# Patient Record
Sex: Female | Born: 1975 | Race: White | Hispanic: No | Marital: Single | Smoking: Current every day smoker
Health system: Southern US, Community
[De-identification: ages and names within clinical notes are randomized; demographics above are authoritative.]

## PROBLEM LIST (undated history)

## (undated) DIAGNOSIS — F321 Major depressive disorder, single episode, moderate: Secondary | ICD-10-CM

## (undated) DIAGNOSIS — H6980 Other specified disorders of Eustachian tube, unspecified ear: Secondary | ICD-10-CM

## (undated) DIAGNOSIS — K5010 Crohn's disease of large intestine without complications: Secondary | ICD-10-CM

## (undated) DIAGNOSIS — M79671 Pain in right foot: Secondary | ICD-10-CM

## (undated) HISTORY — PX: TONSILLECTOMY: SUR1361

## (undated) HISTORY — PX: ROOT CANAL: SHX2363

---
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
## 2002-01-31 ENCOUNTER — Emergency Department (HOSPITAL_COMMUNITY): Admission: EM | Admit: 2002-01-31 | Discharge: 2002-01-31 | Payer: Self-pay | Admitting: Emergency Medicine

## 2008-04-05 ENCOUNTER — Emergency Department (HOSPITAL_BASED_OUTPATIENT_CLINIC_OR_DEPARTMENT_OTHER): Admission: EM | Admit: 2008-04-05 | Discharge: 2008-04-05 | Payer: Self-pay | Admitting: Emergency Medicine

## 2009-08-08 ENCOUNTER — Emergency Department (HOSPITAL_BASED_OUTPATIENT_CLINIC_OR_DEPARTMENT_OTHER): Admission: EM | Admit: 2009-08-08 | Discharge: 2009-08-08 | Payer: Self-pay | Admitting: Emergency Medicine

## 2009-11-30 ENCOUNTER — Emergency Department (HOSPITAL_BASED_OUTPATIENT_CLINIC_OR_DEPARTMENT_OTHER): Admission: EM | Admit: 2009-11-30 | Discharge: 2009-11-30 | Payer: Self-pay | Admitting: Emergency Medicine

## 2010-01-11 ENCOUNTER — Emergency Department (HOSPITAL_BASED_OUTPATIENT_CLINIC_OR_DEPARTMENT_OTHER): Admission: EM | Admit: 2010-01-11 | Discharge: 2010-01-11 | Payer: Self-pay | Admitting: Emergency Medicine

## 2010-01-11 ENCOUNTER — Ambulatory Visit: Payer: Self-pay | Admitting: Radiology

## 2010-04-18 ENCOUNTER — Emergency Department (HOSPITAL_BASED_OUTPATIENT_CLINIC_OR_DEPARTMENT_OTHER): Admission: EM | Admit: 2010-04-18 | Discharge: 2010-04-18 | Payer: Self-pay | Admitting: Emergency Medicine

## 2010-04-26 ENCOUNTER — Emergency Department (HOSPITAL_BASED_OUTPATIENT_CLINIC_OR_DEPARTMENT_OTHER): Admission: EM | Admit: 2010-04-26 | Discharge: 2010-04-26 | Payer: Self-pay | Admitting: Emergency Medicine

## 2010-04-28 ENCOUNTER — Emergency Department (HOSPITAL_BASED_OUTPATIENT_CLINIC_OR_DEPARTMENT_OTHER): Admission: EM | Admit: 2010-04-28 | Discharge: 2010-04-28 | Payer: Self-pay | Admitting: Emergency Medicine

## 2010-05-08 ENCOUNTER — Encounter (HOSPITAL_BASED_OUTPATIENT_CLINIC_OR_DEPARTMENT_OTHER): Admission: RE | Admit: 2010-05-08 | Discharge: 2010-08-06 | Payer: Self-pay | Admitting: General Surgery

## 2010-07-15 ENCOUNTER — Emergency Department (HOSPITAL_BASED_OUTPATIENT_CLINIC_OR_DEPARTMENT_OTHER): Admission: EM | Admit: 2010-07-15 | Discharge: 2010-07-15 | Payer: Self-pay | Admitting: Emergency Medicine

## 2010-09-01 ENCOUNTER — Emergency Department (HOSPITAL_BASED_OUTPATIENT_CLINIC_OR_DEPARTMENT_OTHER): Admission: EM | Admit: 2010-09-01 | Discharge: 2010-09-01 | Payer: Self-pay | Admitting: Emergency Medicine

## 2011-01-14 LAB — COMPREHENSIVE METABOLIC PANEL
ALT: 33 U/L (ref 0–35)
AST: 26 U/L (ref 0–37)
BUN: 10 mg/dL (ref 6–23)
Creatinine, Ser: 0.7 mg/dL (ref 0.4–1.2)
GFR calc Af Amer: >60 mL/min (ref >60)

## 2011-01-17 LAB — STREP A DNA PROBE

## 2011-04-13 ENCOUNTER — Emergency Department (HOSPITAL_BASED_OUTPATIENT_CLINIC_OR_DEPARTMENT_OTHER)
Admission: EM | Admit: 2011-04-13 | Discharge: 2011-04-13 | Disposition: A | Discharge status: Home / Self Care | Payer: Self-pay | Attending: Emergency Medicine | Admitting: Emergency Medicine

## 2011-04-13 DIAGNOSIS — J329 Chronic sinusitis, unspecified: Secondary | ICD-10-CM | POA: Insufficient documentation

## 2011-07-26 LAB — COMPREHENSIVE METABOLIC PANEL
ALT: 17
AST: 24
Albumin: 4.3
Creatinine, Ser: 0.8
Glucose, Bld: 103 — ABNORMAL HIGH
Total Protein: 7.7

## 2011-07-26 LAB — URINALYSIS, ROUTINE W REFLEX MICROSCOPIC
Ketones, ur: 15 — AB
Nitrite: NEGATIVE
Specific Gravity, Urine: 1.03
Urobilinogen, UA: 1
pH: 5.5

## 2011-07-26 LAB — URINE MICROSCOPIC-ADD ON

## 2011-07-26 LAB — DIFFERENTIAL
Basophils Relative: 0
Eosinophils Relative: 1
Lymphs Abs: 2.1
Neutro Abs: 8.5 — ABNORMAL HIGH
Neutrophils Relative %: 74

## 2011-07-26 LAB — CBC
HCT: 44.6
Hemoglobin: 15.8 — ABNORMAL HIGH
MCHC: 35.4
MCV: 86
RBC: 5.18 — ABNORMAL HIGH
RDW: 11.4 — ABNORMAL LOW

## 2011-07-26 LAB — LIPASE, BLOOD: Lipase: 55

## 2012-01-29 ENCOUNTER — Encounter (HOSPITAL_BASED_OUTPATIENT_CLINIC_OR_DEPARTMENT_OTHER): Payer: Self-pay | Admitting: *Deleted

## 2012-01-29 ENCOUNTER — Emergency Department (HOSPITAL_BASED_OUTPATIENT_CLINIC_OR_DEPARTMENT_OTHER)
Admission: EM | Admit: 2012-01-29 | Discharge: 2012-01-29 | Disposition: A | Discharge status: Home / Self Care | Payer: Self-pay | Attending: Emergency Medicine | Admitting: Emergency Medicine

## 2012-01-29 DIAGNOSIS — R221 Localized swelling, mass and lump, neck: Secondary | ICD-10-CM | POA: Insufficient documentation

## 2012-01-29 DIAGNOSIS — R220 Localized swelling, mass and lump, head: Secondary | ICD-10-CM | POA: Insufficient documentation

## 2012-01-29 DIAGNOSIS — F17200 Nicotine dependence, unspecified, uncomplicated: Secondary | ICD-10-CM | POA: Insufficient documentation

## 2012-01-29 DIAGNOSIS — K047 Periapical abscess without sinus: Secondary | ICD-10-CM | POA: Insufficient documentation

## 2012-01-29 DIAGNOSIS — K029 Dental caries, unspecified: Secondary | ICD-10-CM | POA: Insufficient documentation

## 2012-01-29 MED ORDER — CLINDAMYCIN HCL 150 MG PO CAPS: 150.0000 mg | ORAL_CAPSULE | Freq: Three times a day (TID) | ORAL | Status: AC

## 2012-01-29 NOTE — ED Provider Notes (Signed)
Medical screening examination/treatment/procedure(s) were performed by non-physician practitioner and as supervising physician I was immediately available for consultation/collaboration.   Gwyneth Sprout, MD 01/29/12 [phone]

## 2012-01-29 NOTE — Discharge Instructions (Signed)
Dental Abscess A dental abscess usually starts from an infected tooth. Antibiotic medicine and pain pills can be helpful, but dental infections require the attention of a dentist. Rinse around the infected area often with salt water (a pinch of salt in 8 oz of warm water). Do not apply heat to the outside of your face. See your dentist or oral surgeon as soon as possible.  SEEK IMMEDIATE MEDICAL CARE IF:  You have increasing, severe pain that is not relieved by medicine.   You or your child has an oral temperature above 102 F (38.9 C), not controlled by medicine.   Your baby is older than 3 months with a rectal temperature of 102 F (38.9 C) or higher.   Your baby is 3 months old or younger with a rectal temperature of 100.4 F (38 C) or higher.   You develop chills, severe headache, difficulty breathing, or trouble swallowing.   You have swelling in the neck or around the eye.  Document Released: 10/15/2005 Document Revised: 10/04/2011 Document Reviewed: 03/26/2007 [employer] Patient Information 2012 [employer]. 

## 2012-01-29 NOTE — ED Notes (Signed)
Pt. Is to have her teeth fixed in approx. 2 wks.  Noted abcess behind the lower two front teeth.  Pt. Reports no pain and wants an abx. For her abcess.

## 2012-01-29 NOTE — ED Provider Notes (Signed)
History     CSN: [id number]  Arrival date & time 01/29/12  1434   First MD Initiated Contact with Patient 01/29/12 1450      Chief Complaint  Patient presents with  . Abscess    just behind the lower front teeth she has an abcess that has drained and builds back up with fluid per Pt.    (Consider location/radiation/quality/duration/timing/severity/associated sxs/prior treatment) HPI Comments: Pt states that she is supposed to have multiple upper teeth fixed in 2 weeks:and she noted some swelling and drainage from the gum in the low front teeth  Patient is a 36 y.o. female presenting with tooth pain. The history is provided by the patient. No language interpreter was used.  Dental PainThe primary symptoms include mouth pain. Primary symptoms do not include fever or sore throat. The symptoms began 12 to 24 hours ago. The symptoms are worsening. The symptoms occur constantly.  Additional symptoms do not include: jaw pain.    History reviewed. No pertinent past medical history.  Past Surgical History  Procedure Date  . Root canal     History reviewed. No pertinent family history.  History  Substance Use Topics  . Smoking status: Current Everyday Smoker -- 0.5 packs/day    Types: Cigarettes  . Smokeless tobacco: Not on file  . Alcohol Use: Yes    OB History    Grav Para Term Preterm Abortions TAB SAB Ect Mult Living                  Review of Systems  Constitutional: Negative for fever.  HENT: Negative.  Negative for sore throat.   Respiratory: Negative.   Cardiovascular: Negative.   Musculoskeletal: Negative.     Allergies  Review of patient's allergies indicates no known allergies.  Home Medications  No current outpatient prescriptions on file.  BP 133/91  Pulse 95  Temp(Src) 98.2 F (36.8 C) (Oral)  Resp 18  Ht 5\' 5"  (1.651 m)  Wt 270 lb (122.471 kg)  BMI 44.93 kg/m2  SpO2 96%  LMP 01/15/2012  Physical Exam  Nursing note and vitals  reviewed. Constitutional: She is oriented to person, place, and time. She appears well-developed and well-nourished.  HENT:  Mouth/Throat: Oropharynx is clear and moist.       Pt has multiple decayed teeth:pt has some swelling noted on the gum to the lower front teeth:no drainage noted:no jaw swelling noted  Eyes: Conjunctivae and EOM are normal.  Cardiovascular: Normal rate and regular rhythm.   Pulmonary/Chest: Effort normal and breath sounds normal.  Musculoskeletal: Normal range of motion.  Neurological: She is alert and oriented to person, place, and time.  Skin: Skin is dry.    ED Course  Procedures (including critical care time)  Labs Reviewed - No data to display No results found.   1. Dental abscess       MDM  No sign of ludwigs angina:will treat and pt is supposed to follow up with dentist        Teressa Lower, NP 01/29/12 1511

## 2012-08-29 ENCOUNTER — Encounter (HOSPITAL_BASED_OUTPATIENT_CLINIC_OR_DEPARTMENT_OTHER): Payer: Self-pay

## 2012-08-29 ENCOUNTER — Emergency Department (HOSPITAL_BASED_OUTPATIENT_CLINIC_OR_DEPARTMENT_OTHER)
Admission: EM | Admit: 2012-08-29 | Discharge: 2012-08-29 | Disposition: A | Discharge status: Home / Self Care | Payer: Self-pay | Attending: Emergency Medicine | Admitting: Emergency Medicine

## 2012-08-29 DIAGNOSIS — F17200 Nicotine dependence, unspecified, uncomplicated: Secondary | ICD-10-CM | POA: Insufficient documentation

## 2012-08-29 DIAGNOSIS — J329 Chronic sinusitis, unspecified: Secondary | ICD-10-CM | POA: Insufficient documentation

## 2012-08-29 DIAGNOSIS — R51 Headache: Secondary | ICD-10-CM | POA: Insufficient documentation

## 2012-08-29 DIAGNOSIS — J3489 Other specified disorders of nose and nasal sinuses: Secondary | ICD-10-CM | POA: Insufficient documentation

## 2012-08-29 MED ORDER — NAPROXEN 500 MG PO TABS: 500.0000 mg | ORAL_TABLET | Freq: Two times a day (BID) | ORAL | Status: AC

## 2012-08-29 MED ORDER — AMOXICILLIN 500 MG PO CAPS: 1000.0000 mg | ORAL_CAPSULE | Freq: Three times a day (TID) | ORAL | Status: DC

## 2012-08-29 NOTE — ED Provider Notes (Signed)
History     CSN: [id number]  Arrival date & time 08/29/12  1011   First MD Initiated Contact with Patient 08/29/12 1027      Chief Complaint  Patient presents with  . Facial Pain  . Nasal Congestion    (Consider location/radiation/quality/duration/timing/severity/associated sxs/prior treatment) The history is provided by the patient.   patient is a 36 year old female recently with lots of dental problems. She's also had a lot of nasal congestion due to allergies is been ongoing for 3 weeks. Starting with the left maxillary facial pain a few days ago. No fever the congestion has persisted.  History reviewed. No pertinent past medical history.  Past Surgical History  Procedure Date  . Root canal   . Tonsillectomy     No family history on file.  History  Substance Use Topics  . Smoking status: Current Every Day Smoker -- 0.5 packs/day    Types: Cigarettes  . Smokeless tobacco: Not on file  . Alcohol Use: Yes     occasional    OB History    Grav Para Term Preterm Abortions TAB SAB Ect Mult Living                  Review of Systems  Constitutional: Negative for fever.  HENT: Positive for congestion and sinus pressure.   Eyes: Negative for redness.  Respiratory: Negative for shortness of breath.   Cardiovascular: Negative for chest pain.  Gastrointestinal: Negative for nausea, vomiting and abdominal pain.  Genitourinary: Negative for dysuria.  Musculoskeletal: Negative for back pain.  Skin: Negative for rash.  Neurological: Negative for headaches.  Hematological: Does not bruise/bleed easily.    Allergies  Review of patient's allergies indicates no known allergies.  Home Medications   Current Outpatient Rx  Name Route Sig Dispense Refill  . AMOXICILLIN 500 MG PO CAPS Oral Take 2 capsules (1,000 mg total) by mouth 3 (three) times daily. 30 capsule 0  . NAPROXEN 500 MG PO TABS Oral Take 1 tablet (500 mg total) by mouth 2 (two) times daily. 14 tablet 0     BP 127/70  Pulse 97  Temp 98 F (36.7 C) (Oral)  Resp 16  Ht 5\' 4"  (1.626 m)  Wt 280 lb (127.007 kg)  BMI 48.06 kg/m2  SpO2 98%  Physical Exam  Nursing note and vitals reviewed. Constitutional: She is oriented to person, place, and time. She appears well-developed and well-nourished. No distress.  HENT:  Head: Normocephalic.  Mouth/Throat: Oropharynx is clear and moist.       Left maxillary sinus tenderness to palpation. No teeth tenderness.  Eyes: Conjunctivae normal and EOM are normal. Pupils are equal, round, and reactive to light.  Neck: Normal range of motion. Neck supple.  Cardiovascular: Normal rate, regular rhythm and normal heart sounds.   No murmur heard. Pulmonary/Chest: Effort normal and breath sounds normal. No respiratory distress.  Abdominal: Soft. Bowel sounds are normal.  Musculoskeletal: Normal range of motion.  Neurological: She is alert and oriented to person, place, and time. No cranial nerve deficit. She exhibits normal muscle tone. Coordination normal.  Skin: Skin is warm. No rash noted. No erythema.    ED Course  Procedures (including critical care time)  Labs Reviewed - No data to display No results found.   1. Sinusitis       MDM  Symptoms not inconsistent with sinusitis. Patient has allergy symptoms head congestion for about 3 weeks. Does have tenderness over the left maxillary sinus. Did discuss  the possibility of a tooth abscess she has had dental problems in the past. Will treat with 1 g of amoxicillin for 10 days. And also Naprosyn for the discomfort. Patient will return if not improved or if worse and she also consider followup with her dentist.        Shelda Jakes, MD 08/29/12 1049

## 2012-08-29 NOTE — ED Notes (Signed)
Pt reports she awakened last night with facial pain.  She has had nasal congestion x 1 week unrelieved after taking OTC medications.

## 2013-04-28 ENCOUNTER — Encounter (HOSPITAL_BASED_OUTPATIENT_CLINIC_OR_DEPARTMENT_OTHER): Payer: Self-pay | Admitting: Family Medicine

## 2013-04-28 ENCOUNTER — Emergency Department (HOSPITAL_BASED_OUTPATIENT_CLINIC_OR_DEPARTMENT_OTHER)
Admission: EM | Admit: 2013-04-28 | Discharge: 2013-04-28 | Disposition: A | Discharge status: Home / Self Care | Payer: Self-pay | Attending: Emergency Medicine | Admitting: Emergency Medicine

## 2013-04-28 DIAGNOSIS — E669 Obesity, unspecified: Secondary | ICD-10-CM | POA: Insufficient documentation

## 2013-04-28 DIAGNOSIS — K029 Dental caries, unspecified: Secondary | ICD-10-CM

## 2013-04-28 DIAGNOSIS — Z79899 Other long term (current) drug therapy: Secondary | ICD-10-CM | POA: Insufficient documentation

## 2013-04-28 DIAGNOSIS — F17200 Nicotine dependence, unspecified, uncomplicated: Secondary | ICD-10-CM | POA: Insufficient documentation

## 2013-04-28 MED ORDER — IBUPROFEN 800 MG PO TABS
800.0000 mg | ORAL_TABLET | Freq: Once | ORAL | Status: DC
  Filled 2013-04-28: qty 1

## 2013-04-28 MED ORDER — PENICILLIN V POTASSIUM 250 MG PO TABS
500.0000 mg | ORAL_TABLET | Freq: Once | ORAL | Status: AC
  Administered 2013-04-28: 500 mg via ORAL
  Filled 2013-04-28: qty 2

## 2013-04-28 MED ORDER — PENICILLIN V POTASSIUM 500 MG PO TABS: 500.0000 mg | ORAL_TABLET | Freq: Three times a day (TID) | ORAL | Status: DC

## 2013-04-28 NOTE — ED Notes (Signed)
Pt c/o right lower back "tooth abscess". Pt sts she has "maxed out" dental insurance this year and is hoping to wait until January for dentist to "fix" tooth.

## 2013-04-28 NOTE — ED Provider Notes (Signed)
History    CSN: [id number] Arrival date & time 04/28/13  1017  First MD Initiated Contact with Patient 04/28/13 1038     Chief Complaint  Patient presents with  . Dental Problem   (Consider location/radiation/quality/duration/timing/severity/associated sxs/prior Treatment) Patient is a 37 y.o. female presenting with tooth pain. The history is provided by the patient.  Dental Pain Location:  Lower Lower teeth location:  21/LL 1st bicuspid Quality:  Aching Severity:  Moderate Onset quality:  Gradual Timing:  Constant Progression:  Worsening Chronicity:  Recurrent Context: dental caries   Previous work-up:  Dental exam Relieved by:  None tried Worsened by:  Nothing tried Ineffective treatments:  None tried Associated symptoms: no congestion, no difficulty swallowing, no facial swelling, no fever, no gum swelling, no headaches, no neck pain and no trismus   Risk factors: smoking    History reviewed. No pertinent past medical history. Past Surgical History  Procedure Laterality Date  . Root canal    . Tonsillectomy     No family history on file. History  Substance Use Topics  . Smoking status: Current Every Day Smoker -- 0.50 packs/day    Types: Cigarettes  . Smokeless tobacco: Not on file  . Alcohol Use: Yes     Comment: occasional   OB History   Grav Para Term Preterm Abortions TAB SAB Ect Mult Living                 Review of Systems  Constitutional: Negative for fever.  HENT: Negative for congestion, facial swelling and neck pain.   Neurological: Negative for headaches.  All other systems reviewed and are negative.    Allergies  Review of patient's allergies indicates no known allergies.  Home Medications   Current Outpatient Rx  Name  Route  Sig  Dispense  Refill  . amoxicillin (AMOXIL) 500 MG capsule   Oral   Take 2 capsules (1,000 mg total) by mouth 3 (three) times daily.   30 capsule   0   . naproxen (NAPROSYN) 500 MG tablet   Oral   Take 1  tablet (500 mg total) by mouth 2 (two) times daily.   14 tablet   0    BP 130/89  Pulse 94  Temp(Src) 98 F (36.7 C) (Oral)  Resp 16  Ht 5\' 4"  (1.626 m)  Wt 300 lb (136.079 kg)  BMI 51.47 kg/m2  SpO2 100% Physical Exam  Vitals reviewed. Constitutional: She is oriented to person, place, and time. She appears well-developed and well-nourished.  obese  HENT:  Head: Normocephalic and atraumatic.  Mouth/Throat: Oropharynx is clear and moist.  Diffuse caries right lower molars missing, bicuspids with caries and ttp, no gum swelling or abscess noted.   Eyes: Conjunctivae are normal. Pupils are equal, round, and reactive to light.  Neck: Normal range of motion. Neck supple.  Cardiovascular: Normal rate and normal heart sounds.   Pulmonary/Chest: Effort normal.  Abdominal: Soft.  Musculoskeletal: Normal range of motion.  Neurological: She is alert and oriented to person, place, and time.  nrmal gait  Skin: Skin is warm and dry.  Psychiatric: She has a normal mood and affect.    ED Course  Procedures (including critical care time) Labs Reviewed - No data to display No results found. No diagnosis found.  MDM  Patient has dentis and has plan in place to have lower teeth removed.  She has not been on abx for at least several months.  Plan pcn.  Hilario Quarry, MD 04/28/13 1049

## 2014-05-10 ENCOUNTER — Emergency Department (HOSPITAL_BASED_OUTPATIENT_CLINIC_OR_DEPARTMENT_OTHER)
Admission: EM | Admit: 2014-05-10 | Discharge: 2014-05-10 | Disposition: A | Discharge status: Home / Self Care | Payer: Self-pay | Attending: Emergency Medicine | Admitting: Emergency Medicine

## 2014-05-10 ENCOUNTER — Encounter (HOSPITAL_BASED_OUTPATIENT_CLINIC_OR_DEPARTMENT_OTHER): Payer: Self-pay | Admitting: Emergency Medicine

## 2014-05-10 DIAGNOSIS — F17200 Nicotine dependence, unspecified, uncomplicated: Secondary | ICD-10-CM | POA: Insufficient documentation

## 2014-05-10 DIAGNOSIS — L03211 Cellulitis of face: Secondary | ICD-10-CM | POA: Insufficient documentation

## 2014-05-10 DIAGNOSIS — K047 Periapical abscess without sinus: Secondary | ICD-10-CM | POA: Insufficient documentation

## 2014-05-10 DIAGNOSIS — L0201 Cutaneous abscess of face: Secondary | ICD-10-CM | POA: Insufficient documentation

## 2014-05-10 DIAGNOSIS — Z791 Long term (current) use of non-steroidal anti-inflammatories (NSAID): Secondary | ICD-10-CM | POA: Insufficient documentation

## 2014-05-10 MED ORDER — HYDROCODONE-ACETAMINOPHEN 5-325 MG PO TABS: 1.0000 | ORAL_TABLET | Freq: Four times a day (QID) | ORAL | Status: DC | PRN

## 2014-05-10 MED ORDER — PENICILLIN V POTASSIUM 500 MG PO TABS: 500.0000 mg | ORAL_TABLET | Freq: Three times a day (TID) | ORAL | Status: DC

## 2014-05-10 NOTE — ED Provider Notes (Signed)
CSN: [id number]     Arrival date & time 05/10/14  1909 History  This chart was scribed for Shanna Cisco, MD by Quintella Reichert, ED scribe.  This patient was seen in room MH04/MH04 and the patient's care was started at 8:55 PM.   Chief Complaint  Patient presents with  . Dental Pain    Patient is a 38 y.o. female presenting with tooth pain. The history is provided by the patient. No language interpreter was used.  Dental Pain Toothache location: left side. Severity:  Moderate Duration: today. Progression:  Worsening Chronicity:  Recurrent Context: abscess (pt suspects)   Associated symptoms: gum swelling   Associated symptoms: no congestion, no difficulty swallowing, no fever, no headaches and no neck pain   Risk factors: smoking     HPI Comments: Melissa Coleman is a 38 y.o. female who presents to the Emergency Department complaining of a suspected left-sided dental abscess.  Pt states she has a partial filling in that area and this evening she noticed pain and swelling there.  Pain has worsened since onset.  She states pain is not severe but she is concerned she may need antibiotics to prevent her symptoms from worsening.  She denies swelling to throat, tongue, or any other area.  She denies fever.  Pt admits to prior h/o dental abscess with similar associated symptoms.  She has a Education officer, community.   History reviewed. No pertinent past medical history.   Past Surgical History  Procedure Laterality Date  . Root canal    . Tonsillectomy      History reviewed. No pertinent family history.   History  Substance Use Topics  . Smoking status: Current Every Day Smoker -- 0.50 packs/day    Types: Cigarettes  . Smokeless tobacco: Not on file  . Alcohol Use: Yes     Comment: occasional    OB History   Grav Para Term Preterm Abortions TAB SAB Ect Mult Living                   Review of Systems  Constitutional: Negative for fever, chills, diaphoresis, activity change, appetite  change and fatigue.  HENT: Positive for dental problem. Negative for congestion, rhinorrhea, sore throat and trouble swallowing.   Eyes: Negative for photophobia and discharge.  Respiratory: Negative for cough, chest tightness and shortness of breath.   Cardiovascular: Negative for chest pain, palpitations and leg swelling.  Gastrointestinal: Negative for nausea, vomiting, abdominal pain and diarrhea.  Endocrine: Negative for polydipsia and polyuria.  Genitourinary: Negative for dysuria, frequency, difficulty urinating and pelvic pain.  Musculoskeletal: Negative for arthralgias, back pain, neck pain and neck stiffness.  Skin: Negative for color change and wound.  Allergic/Immunologic: Negative for immunocompromised state.  Neurological: Negative for facial asymmetry, weakness, numbness and headaches.  Hematological: Does not bruise/bleed easily.  Psychiatric/Behavioral: Negative for confusion and agitation.      Allergies  Review of patient's allergies indicates no known allergies.  Home Medications   Prior to Admission medications   Medication Sig Start Date End Date Taking? Authorizing Provider  acetaminophen (TYLENOL) 325 MG tablet Take 650 mg by mouth every 6 (six) hours as needed.   Yes Historical Provider, MD  amoxicillin (AMOXIL) 500 MG capsule Take 2 capsules (1,000 mg total) by mouth 3 (three) times daily. 08/29/12   Vanetta Mulders, MD  HYDROcodone-acetaminophen (NORCO) 5-325 MG per tablet Take 1 tablet by mouth every 6 (six) hours as needed for severe pain. 05/10/14   Megan E  Micheline Mazeocherty, MD  naproxen (NAPROSYN) 500 MG tablet Take 1 tablet (500 mg total) by mouth 2 (two) times daily. 08/29/12   Vanetta MuldersScott Zackowski, MD  penicillin v potassium (VEETID) 500 MG tablet Take 1 tablet (500 mg total) by mouth 3 (three) times daily. 04/28/13   Hilario Quarryanielle S Ray, MD  penicillin v potassium (VEETID) 500 MG tablet Take 1 tablet (500 mg total) by mouth 3 (three) times daily. 05/10/14   Shanna CiscoMegan E Docherty,  MD   BP 123/80  Pulse 84  Temp(Src) 98 F (36.7 C) (Oral)  Resp 18  Ht 5\' 4"  (1.626 m)  Wt 300 lb (136.079 kg)  BMI 51.47 kg/m2  SpO2 98%  Physical Exam  Nursing note and vitals reviewed. Constitutional: She is oriented to person, place, and time. She appears well-developed and well-nourished. No distress.  HENT:  Head: Normocephalic.  Mouth/Throat: Oropharynx is clear and moist.    Erythema around gumline of marked tooth.  Soft tissue edema of left cheek, without palpable buccal abscess.  Eyes: Pupils are equal, round, and reactive to light.  Neck: Neck supple.  Cardiovascular: Normal rate, regular rhythm and normal heart sounds.   Pulmonary/Chest: Effort normal and breath sounds normal. No respiratory distress. She has no wheezes.  Abdominal: Soft. She exhibits no distension. There is no tenderness. There is no rebound and no guarding.  Musculoskeletal: She exhibits no edema and no tenderness.  Neurological: She is alert and oriented to person, place, and time.  Skin: Skin is warm and dry.  Psychiatric: She has a normal mood and affect.    ED Course  Procedures (including critical care time)  DIAGNOSTIC STUDIES: Oxygen Saturation is 98% on room air, normal by my interpretation.    COORDINATION OF CARE: 8:59 PM-Discussed treatment plan which includes antibiotics, pain medication, ice, and dental f/u.  Advised return precautions.  Pt expressed understanding and agreed with plan.   Labs Review Labs Reviewed - No data to display  Imaging Review No results found.   EKG Interpretation None      MDM   Final diagnoses:  Dental abscess  Facial cellulitis    Pt is a 38 y.o. female with Pmhx as above who presents with dental pain localized to tooth #11 as well as the development of L facial swelling today. No fever chills, trismus, trouble swallowing. On PE, L facial swelling but no palpable abscess formation in buccal mucosa or along gum line which is erythematous.  Will start on pen vk for dental abscess w/ facial cellulitis. She will call her dentist tomorrow.  Return precautions given for new or worsening symptoms including worsening pain, swelling, fever, trouble swallowing.      I personally performed the services described in this documentation, which was scribed in my presence. The recorded information has been reviewed and is accurate.    Shanna CiscoMegan E Docherty, MD 05/10/14 2105

## 2014-05-10 NOTE — Discharge Instructions (Signed)

## 2014-05-10 NOTE — ED Notes (Signed)
Pt has some gum swelling on left side of mouth, unable to put partial dentures in due to swelling

## 2016-01-15 DIAGNOSIS — G5702 Lesion of sciatic nerve, left lower limb: Secondary | ICD-10-CM | POA: Insufficient documentation

## 2016-02-17 DIAGNOSIS — Z9109 Other allergy status, other than to drugs and biological substances: Secondary | ICD-10-CM | POA: Diagnosis present

## 2016-02-17 DIAGNOSIS — F17200 Nicotine dependence, unspecified, uncomplicated: Secondary | ICD-10-CM | POA: Insufficient documentation

## 2016-02-17 DIAGNOSIS — J0121 Acute recurrent ethmoidal sinusitis: Secondary | ICD-10-CM | POA: Insufficient documentation

## 2016-02-17 DIAGNOSIS — H6980 Other specified disorders of Eustachian tube, unspecified ear: Secondary | ICD-10-CM

## 2016-02-17 HISTORY — DX: Other specified disorders of Eustachian tube, unspecified ear: H69.80

## 2016-02-18 DIAGNOSIS — H93291 Other abnormal auditory perceptions, right ear: Secondary | ICD-10-CM | POA: Insufficient documentation

## 2016-02-26 DIAGNOSIS — M62830 Muscle spasm of back: Secondary | ICD-10-CM | POA: Insufficient documentation

## 2016-02-26 DIAGNOSIS — M79671 Pain in right foot: Secondary | ICD-10-CM

## 2016-02-26 HISTORY — DX: Pain in right foot: M79.671

## 2016-07-05 DIAGNOSIS — J301 Allergic rhinitis due to pollen: Secondary | ICD-10-CM | POA: Diagnosis present

## 2017-03-15 DIAGNOSIS — E782 Mixed hyperlipidemia: Secondary | ICD-10-CM | POA: Diagnosis present

## 2017-03-15 DIAGNOSIS — E559 Vitamin D deficiency, unspecified: Secondary | ICD-10-CM | POA: Insufficient documentation

## 2017-03-15 DIAGNOSIS — Z6841 Body Mass Index (BMI) 40.0 and over, adult: Secondary | ICD-10-CM

## 2017-04-17 DIAGNOSIS — M5416 Radiculopathy, lumbar region: Secondary | ICD-10-CM | POA: Insufficient documentation

## 2017-04-17 DIAGNOSIS — M48061 Spinal stenosis, lumbar region without neurogenic claudication: Secondary | ICD-10-CM | POA: Insufficient documentation

## 2017-05-08 DIAGNOSIS — F321 Major depressive disorder, single episode, moderate: Secondary | ICD-10-CM

## 2017-05-08 DIAGNOSIS — F411 Generalized anxiety disorder: Secondary | ICD-10-CM | POA: Diagnosis present

## 2017-05-08 HISTORY — DX: Major depressive disorder, single episode, moderate: F32.1

## 2017-07-09 DIAGNOSIS — L309 Dermatitis, unspecified: Secondary | ICD-10-CM | POA: Insufficient documentation

## 2017-07-09 DIAGNOSIS — G4700 Insomnia, unspecified: Secondary | ICD-10-CM | POA: Diagnosis present

## 2017-07-10 DIAGNOSIS — Z9109 Other allergy status, other than to drugs and biological substances: Secondary | ICD-10-CM | POA: Insufficient documentation

## 2018-06-16 DIAGNOSIS — Z87442 Personal history of urinary calculi: Secondary | ICD-10-CM | POA: Insufficient documentation

## 2018-06-16 DIAGNOSIS — R7301 Impaired fasting glucose: Secondary | ICD-10-CM | POA: Diagnosis present

## 2019-08-07 ENCOUNTER — Observation Stay (HOSPITAL_BASED_OUTPATIENT_CLINIC_OR_DEPARTMENT_OTHER)
Admission: EM | Admit: 2019-08-07 | Discharge: 2019-08-08 | Disposition: A | Discharge status: Home / Self Care | Payer: Self-pay | Attending: General Surgery | Admitting: General Surgery

## 2019-08-07 ENCOUNTER — Emergency Department (HOSPITAL_COMMUNITY): Payer: Self-pay | Admitting: Registered Nurse

## 2019-08-07 ENCOUNTER — Encounter: Payer: Self-pay | Admitting: Emergency Medicine

## 2019-08-07 ENCOUNTER — Encounter (HOSPITAL_COMMUNITY)
Admission: EM | Disposition: A | Discharge status: Home / Self Care | Payer: Self-pay | Source: Home / Self Care | Attending: Emergency Medicine

## 2019-08-07 ENCOUNTER — Emergency Department (HOSPITAL_BASED_OUTPATIENT_CLINIC_OR_DEPARTMENT_OTHER): Payer: Self-pay

## 2019-08-07 ENCOUNTER — Other Ambulatory Visit: Payer: Self-pay

## 2019-08-07 ENCOUNTER — Emergency Department (HOSPITAL_COMMUNITY): Payer: Self-pay

## 2019-08-07 DIAGNOSIS — M5430 Sciatica, unspecified side: Secondary | ICD-10-CM | POA: Insufficient documentation

## 2019-08-07 DIAGNOSIS — F17210 Nicotine dependence, cigarettes, uncomplicated: Secondary | ICD-10-CM | POA: Insufficient documentation

## 2019-08-07 DIAGNOSIS — J301 Allergic rhinitis due to pollen: Secondary | ICD-10-CM | POA: Diagnosis present

## 2019-08-07 DIAGNOSIS — M1990 Unspecified osteoarthritis, unspecified site: Secondary | ICD-10-CM | POA: Insufficient documentation

## 2019-08-07 DIAGNOSIS — K660 Peritoneal adhesions (postprocedural) (postinfection): Secondary | ICD-10-CM | POA: Insufficient documentation

## 2019-08-07 DIAGNOSIS — Z6841 Body Mass Index (BMI) 40.0 and over, adult: Secondary | ICD-10-CM | POA: Insufficient documentation

## 2019-08-07 DIAGNOSIS — Z9109 Other allergy status, other than to drugs and biological substances: Secondary | ICD-10-CM | POA: Diagnosis present

## 2019-08-07 DIAGNOSIS — R7301 Impaired fasting glucose: Secondary | ICD-10-CM | POA: Diagnosis present

## 2019-08-07 DIAGNOSIS — K8012 Calculus of gallbladder with acute and chronic cholecystitis without obstruction: Principal | ICD-10-CM | POA: Insufficient documentation

## 2019-08-07 DIAGNOSIS — E782 Mixed hyperlipidemia: Secondary | ICD-10-CM | POA: Diagnosis present

## 2019-08-07 DIAGNOSIS — F411 Generalized anxiety disorder: Secondary | ICD-10-CM | POA: Diagnosis present

## 2019-08-07 DIAGNOSIS — K7581 Nonalcoholic steatohepatitis (NASH): Secondary | ICD-10-CM | POA: Insufficient documentation

## 2019-08-07 DIAGNOSIS — Z419 Encounter for procedure for purposes other than remedying health state, unspecified: Secondary | ICD-10-CM

## 2019-08-07 DIAGNOSIS — Z20828 Contact with and (suspected) exposure to other viral communicable diseases: Secondary | ICD-10-CM | POA: Insufficient documentation

## 2019-08-07 DIAGNOSIS — K819 Cholecystitis, unspecified: Secondary | ICD-10-CM

## 2019-08-07 DIAGNOSIS — R1011 Right upper quadrant pain: Secondary | ICD-10-CM

## 2019-08-07 DIAGNOSIS — K812 Acute cholecystitis with chronic cholecystitis: Secondary | ICD-10-CM | POA: Diagnosis present

## 2019-08-07 DIAGNOSIS — G4700 Insomnia, unspecified: Secondary | ICD-10-CM | POA: Diagnosis present

## 2019-08-07 HISTORY — DX: Crohn's disease of large intestine without complications: K50.10

## 2019-08-07 HISTORY — PX: CHOLECYSTECTOMY: SHX55

## 2019-08-07 LAB — CBC WITH DIFFERENTIAL/PLATELET
Abs Immature Granulocytes: 0.04 10*3/uL (ref 0.00–0.07)
Basophils Absolute: 0.1 10*3/uL (ref 0.0–0.1)
Basophils Relative: 1 %
Eosinophils Absolute: 0 10*3/uL (ref 0.0–0.5)
Eosinophils Relative: 0 %
HCT: 47.1 % — ABNORMAL HIGH (ref 36.0–46.0)
Hemoglobin: 15.2 g/dL — ABNORMAL HIGH (ref 12.0–15.0)
Immature Granulocytes: 0 %
Lymphocytes Relative: 16 %
Lymphs Abs: 2 10*3/uL (ref 0.7–4.0)
MCH: 28.9 pg (ref 26.0–34.0)
MCHC: 32.3 g/dL (ref 30.0–36.0)
MCV: 89.5 fL (ref 80.0–100.0)
Monocytes Absolute: 0.5 10*3/uL (ref 0.1–1.0)
Monocytes Relative: 4 %
Neutro Abs: 9.8 10*3/uL — ABNORMAL HIGH (ref 1.7–7.7)
Neutrophils Relative %: 79 %
Platelets: 270 10*3/uL (ref 150–400)
RBC: 5.26 MIL/uL — ABNORMAL HIGH (ref 3.87–5.11)
RDW: 12.7 % (ref 11.5–15.5)
WBC: 12.4 10*3/uL — ABNORMAL HIGH (ref 4.0–10.5)
nRBC: 0 % (ref 0.0–0.2)

## 2019-08-07 LAB — COMPREHENSIVE METABOLIC PANEL
ALT: 23 U/L (ref 0–44)
AST: 24 U/L (ref 15–41)
Albumin: 4.3 g/dL (ref 3.5–5.0)
Alkaline Phosphatase: 70 U/L (ref 38–126)
Anion gap: 13 (ref 5–15)
BUN: 13 mg/dL (ref 6–20)
CO2: 23 mmol/L (ref 22–32)
Calcium: 9.5 mg/dL (ref 8.9–10.3)
Chloride: 102 mmol/L (ref 98–111)
Creatinine, Ser: 0.76 mg/dL (ref 0.44–1.00)
GFR calc Af Amer: >60 mL/min (ref >60)
GFR calc non Af Amer: >60 mL/min (ref >60)
Glucose, Bld: 146 mg/dL — ABNORMAL HIGH (ref 70–99)
Potassium: 4 mmol/L (ref 3.5–5.1)
Sodium: 138 mmol/L (ref 135–145)
Total Bilirubin: 0.7 mg/dL (ref 0.3–1.2)
Total Protein: 7.6 g/dL (ref 6.5–8.1)

## 2019-08-07 LAB — SARS CORONAVIRUS 2 BY RT PCR (HOSPITAL ORDER, PERFORMED IN ~~LOC~~ HOSPITAL LAB): SARS Coronavirus 2: NEGATIVE

## 2019-08-07 LAB — SARS CORONAVIRUS 2 (TAT 6-24 HRS): SARS Coronavirus 2: NEGATIVE

## 2019-08-07 LAB — TROPONIN I (HIGH SENSITIVITY): Troponin I (High Sensitivity): <2 ng/L (ref <18)

## 2019-08-07 LAB — PREGNANCY, URINE: Preg Test, Ur: NEGATIVE

## 2019-08-07 LAB — LIPASE, BLOOD: Lipase: 26 U/L (ref 11–51)

## 2019-08-07 SURGERY — LAPAROSCOPIC CHOLECYSTECTOMY WITH INTRAOPERATIVE CHOLANGIOGRAM
Anesthesia: General | Site: Abdomen

## 2019-08-07 MED ORDER — SODIUM CHLORIDE 0.9 % IV SOLN
2.0000 g | Freq: Once | INTRAVENOUS | Status: DC
  Filled 2019-08-07: qty 20

## 2019-08-07 MED ORDER — DOCUSATE SODIUM 100 MG PO CAPS
100.0000 mg | ORAL_CAPSULE | Freq: Two times a day (BID) | ORAL | Status: DC
  Administered 2019-08-07: 100 mg via ORAL
  Filled 2019-08-07: qty 1

## 2019-08-07 MED ORDER — MORPHINE SULFATE (PF) 4 MG/ML IV SOLN
4.0000 mg | INTRAVENOUS | Status: DC | PRN
  Administered 2019-08-07 (×2): 4 mg via INTRAVENOUS
  Filled 2019-08-07 (×2): qty 1

## 2019-08-07 MED ORDER — SUCCINYLCHOLINE CHLORIDE 200 MG/10ML IV SOSY
PREFILLED_SYRINGE | INTRAVENOUS | Status: DC | PRN
  Administered 2019-08-07: 100 mg via INTRAVENOUS

## 2019-08-07 MED ORDER — SODIUM CHLORIDE 0.9 % IV SOLN
INTRAVENOUS | Status: DC
  Administered 2019-08-07: 19:00:00 via INTRAVENOUS

## 2019-08-07 MED ORDER — CHLORHEXIDINE GLUCONATE CLOTH 2 % EX PADS
6.0000 | MEDICATED_PAD | Freq: Once | CUTANEOUS | Status: AC
  Administered 2019-08-07: 6 via TOPICAL

## 2019-08-07 MED ORDER — DEXAMETHASONE SODIUM PHOSPHATE 4 MG/ML IJ SOLN
INTRAMUSCULAR | Status: DC | PRN
  Administered 2019-08-07: 4 mg via INTRAVENOUS

## 2019-08-07 MED ORDER — ONDANSETRON HCL 4 MG/2ML IJ SOLN
4.0000 mg | Freq: Once | INTRAMUSCULAR | Status: AC
  Administered 2019-08-07: 4 mg via INTRAVENOUS
  Filled 2019-08-07: qty 2

## 2019-08-07 MED ORDER — SODIUM CHLORIDE 0.9 % IV SOLN
INTRAVENOUS | Status: DC | PRN
  Administered 2019-08-07: 5 mL

## 2019-08-07 MED ORDER — LIP MEDEX EX OINT
1.0000 "application " | TOPICAL_OINTMENT | Freq: Two times a day (BID) | CUTANEOUS | Status: DC
  Filled 2019-08-07: qty 7

## 2019-08-07 MED ORDER — GLYCOPYRROLATE PF 0.2 MG/ML IJ SOSY
PREFILLED_SYRINGE | INTRAMUSCULAR | Status: AC
  Filled 2019-08-07: qty 1

## 2019-08-07 MED ORDER — ALUM & MAG HYDROXIDE-SIMETH 200-200-20 MG/5ML PO SUSP: 30.0000 mL | Freq: Four times a day (QID) | ORAL | Status: DC | PRN

## 2019-08-07 MED ORDER — SUCCINYLCHOLINE CHLORIDE 200 MG/10ML IV SOSY
PREFILLED_SYRINGE | INTRAVENOUS | Status: AC
  Filled 2019-08-07: qty 10

## 2019-08-07 MED ORDER — NAPROXEN 500 MG PO TABS
500.0000 mg | ORAL_TABLET | Freq: Two times a day (BID) | ORAL | Status: DC
  Filled 2019-08-07: qty 2

## 2019-08-07 MED ORDER — BUPIVACAINE HCL (PF) 0.25 % IJ SOLN
INTRAMUSCULAR | Status: DC | PRN
  Administered 2019-08-07: 30 mL

## 2019-08-07 MED ORDER — DIPHENHYDRAMINE HCL 50 MG/ML IJ SOLN: 12.5000 mg | Freq: Four times a day (QID) | INTRAMUSCULAR | Status: DC | PRN

## 2019-08-07 MED ORDER — ROCURONIUM BROMIDE 10 MG/ML (PF) SYRINGE
PREFILLED_SYRINGE | INTRAVENOUS | Status: DC | PRN
  Administered 2019-08-07: 20 mg via INTRAVENOUS
  Administered 2019-08-07: 50 mg via INTRAVENOUS

## 2019-08-07 MED ORDER — NAPROXEN 500 MG PO TABS: 500.0000 mg | ORAL_TABLET | Freq: Two times a day (BID) | ORAL | Status: DC | PRN

## 2019-08-07 MED ORDER — FENTANYL CITRATE (PF) 100 MCG/2ML IJ SOLN
INTRAMUSCULAR | Status: AC
  Filled 2019-08-07: qty 2

## 2019-08-07 MED ORDER — LIDOCAINE 2% (20 MG/ML) 5 ML SYRINGE
INTRAMUSCULAR | Status: DC | PRN
  Administered 2019-08-07: 60 mg via INTRAVENOUS

## 2019-08-07 MED ORDER — POLYETHYLENE GLYCOL 3350 17 G PO PACK: 17.0000 g | PACK | Freq: Every day | ORAL | Status: DC | PRN

## 2019-08-07 MED ORDER — GABAPENTIN 600 MG PO TABS
300.0000 mg | ORAL_TABLET | Freq: Two times a day (BID) | ORAL | Status: DC
  Filled 2019-08-07: qty 0.5

## 2019-08-07 MED ORDER — LACTATED RINGERS IV BOLUS: 1000.0000 mL | Freq: Three times a day (TID) | INTRAVENOUS | Status: DC | PRN

## 2019-08-07 MED ORDER — FENTANYL CITRATE (PF) 100 MCG/2ML IJ SOLN
INTRAMUSCULAR | Status: DC | PRN
  Administered 2019-08-07 (×3): 50 ug via INTRAVENOUS
  Administered 2019-08-07: 100 ug via INTRAVENOUS
  Administered 2019-08-07: 50 ug via INTRAVENOUS

## 2019-08-07 MED ORDER — HYDROCORTISONE 1 % EX CREA: 1.0000 "application " | TOPICAL_CREAM | Freq: Three times a day (TID) | CUTANEOUS | Status: DC | PRN

## 2019-08-07 MED ORDER — CELECOXIB 200 MG PO CAPS
200.0000 mg | ORAL_CAPSULE | ORAL | Status: AC
  Administered 2019-08-07: 200 mg via ORAL
  Filled 2019-08-07: qty 1

## 2019-08-07 MED ORDER — ACETAMINOPHEN 500 MG PO TABS
1000.0000 mg | ORAL_TABLET | Freq: Four times a day (QID) | ORAL | Status: DC
  Administered 2019-08-07 – 2019-08-08 (×2): 1000 mg via ORAL
  Filled 2019-08-07 (×2): qty 2

## 2019-08-07 MED ORDER — 0.9 % SODIUM CHLORIDE (POUR BTL) OPTIME
TOPICAL | Status: DC | PRN
  Administered 2019-08-07: 1000 mL

## 2019-08-07 MED ORDER — OXYCODONE HCL 5 MG PO TABS
5.0000 mg | ORAL_TABLET | ORAL | Status: DC | PRN
  Administered 2019-08-08: 10 mg via ORAL
  Filled 2019-08-07: qty 2

## 2019-08-07 MED ORDER — GABAPENTIN 300 MG PO CAPS
300.0000 mg | ORAL_CAPSULE | Freq: Two times a day (BID) | ORAL | Status: DC
  Administered 2019-08-07: 300 mg via ORAL
  Filled 2019-08-07: qty 1

## 2019-08-07 MED ORDER — SIMETHICONE 80 MG PO CHEW: 40.0000 mg | CHEWABLE_TABLET | Freq: Four times a day (QID) | ORAL | Status: DC | PRN

## 2019-08-07 MED ORDER — DIPHENHYDRAMINE HCL 25 MG PO CAPS: 25.0000 mg | ORAL_CAPSULE | Freq: Four times a day (QID) | ORAL | Status: DC | PRN

## 2019-08-07 MED ORDER — PROPOFOL 10 MG/ML IV BOLUS
INTRAVENOUS | Status: AC
  Filled 2019-08-07: qty 40

## 2019-08-07 MED ORDER — ACETAMINOPHEN 500 MG PO TABS
1000.0000 mg | ORAL_TABLET | ORAL | Status: AC
  Administered 2019-08-07: 1000 mg via ORAL
  Filled 2019-08-07: qty 2

## 2019-08-07 MED ORDER — LACTATED RINGERS IR SOLN
Status: DC | PRN
  Administered 2019-08-07: 1000 mL

## 2019-08-07 MED ORDER — FENTANYL CITRATE (PF) 100 MCG/2ML IJ SOLN
100.0000 ug | Freq: Once | INTRAMUSCULAR | Status: AC
  Administered 2019-08-07: 06:00:00 100 ug via INTRAVENOUS
  Filled 2019-08-07: qty 2

## 2019-08-07 MED ORDER — ONDANSETRON 4 MG PO TBDP: 4.0000 mg | ORAL_TABLET | Freq: Four times a day (QID) | ORAL | Status: DC | PRN

## 2019-08-07 MED ORDER — IOHEXOL 300 MG/ML  SOLN
100.0000 mL | Freq: Once | INTRAMUSCULAR | Status: AC | PRN
  Administered 2019-08-07: 100 mL via INTRAVENOUS

## 2019-08-07 MED ORDER — BISACODYL 10 MG RE SUPP: 10.0000 mg | Freq: Two times a day (BID) | RECTAL | Status: DC | PRN

## 2019-08-07 MED ORDER — PROCHLORPERAZINE EDISYLATE 10 MG/2ML IJ SOLN: 5.0000 mg | INTRAMUSCULAR | Status: DC | PRN

## 2019-08-07 MED ORDER — BUPIVACAINE LIPOSOME 1.3 % IJ SUSP
20.0000 mL | Freq: Once | INTRAMUSCULAR | Status: DC
  Filled 2019-08-07: qty 20

## 2019-08-07 MED ORDER — HYDROCORTISONE (PERIANAL) 2.5 % EX CREA: 1.0000 "application " | TOPICAL_CREAM | Freq: Four times a day (QID) | CUTANEOUS | Status: DC | PRN

## 2019-08-07 MED ORDER — ROCURONIUM BROMIDE 10 MG/ML (PF) SYRINGE
PREFILLED_SYRINGE | INTRAVENOUS | Status: AC
  Filled 2019-08-07: qty 10

## 2019-08-07 MED ORDER — METRONIDAZOLE IN NACL 5-0.79 MG/ML-% IV SOLN
500.0000 mg | INTRAVENOUS | Status: AC
  Administered 2019-08-07: 500 mg via INTRAVENOUS
  Filled 2019-08-07: qty 100

## 2019-08-07 MED ORDER — PHENYLEPHRINE 40 MCG/ML (10ML) SYRINGE FOR IV PUSH (FOR BLOOD PRESSURE SUPPORT)
PREFILLED_SYRINGE | INTRAVENOUS | Status: AC
  Filled 2019-08-07: qty 10

## 2019-08-07 MED ORDER — PHENYLEPHRINE 40 MCG/ML (10ML) SYRINGE FOR IV PUSH (FOR BLOOD PRESSURE SUPPORT)
PREFILLED_SYRINGE | INTRAVENOUS | Status: DC | PRN
  Administered 2019-08-07 (×2): 120 ug via INTRAVENOUS

## 2019-08-07 MED ORDER — METOPROLOL TARTRATE 5 MG/5ML IV SOLN: 5.0000 mg | Freq: Four times a day (QID) | INTRAVENOUS | Status: DC | PRN

## 2019-08-07 MED ORDER — METHOCARBAMOL 500 MG PO TABS: 1000.0000 mg | ORAL_TABLET | Freq: Four times a day (QID) | ORAL | Status: DC | PRN

## 2019-08-07 MED ORDER — PROPOFOL 10 MG/ML IV BOLUS
INTRAVENOUS | Status: DC | PRN
  Administered 2019-08-07: 180 mg via INTRAVENOUS

## 2019-08-07 MED ORDER — ONDANSETRON HCL 4 MG/2ML IJ SOLN
INTRAMUSCULAR | Status: DC | PRN
  Administered 2019-08-07: 4 mg via INTRAVENOUS

## 2019-08-07 MED ORDER — LACTATED RINGERS IV SOLN
INTRAVENOUS | Status: DC
  Administered 2019-08-07: 15:00:00 via INTRAVENOUS
  Administered 2019-08-07: 1000 mL via INTRAVENOUS

## 2019-08-07 MED ORDER — MORPHINE SULFATE (PF) 4 MG/ML IV SOLN
4.0000 mg | Freq: Once | INTRAVENOUS | Status: AC
  Administered 2019-08-07: 4 mg via INTRAVENOUS
  Filled 2019-08-07: qty 1

## 2019-08-07 MED ORDER — SODIUM CHLORIDE 0.9 % IV SOLN
2.0000 g | INTRAVENOUS | Status: AC
  Administered 2019-08-07: 15:00:00 2 g via INTRAVENOUS

## 2019-08-07 MED ORDER — MIDAZOLAM HCL 2 MG/2ML IJ SOLN
INTRAMUSCULAR | Status: AC
  Filled 2019-08-07: qty 2

## 2019-08-07 MED ORDER — PHENOL 1.4 % MT LIQD: 1.0000 | OROMUCOSAL | Status: DC | PRN

## 2019-08-07 MED ORDER — MIDAZOLAM HCL 5 MG/5ML IJ SOLN
INTRAMUSCULAR | Status: DC | PRN
  Administered 2019-08-07: 2 mg via INTRAVENOUS

## 2019-08-07 MED ORDER — BISACODYL 10 MG RE SUPP: 10.0000 mg | Freq: Every day | RECTAL | Status: DC | PRN

## 2019-08-07 MED ORDER — PANTOPRAZOLE SODIUM 40 MG IV SOLR
40.0000 mg | Freq: Every day | INTRAVENOUS | Status: DC
  Administered 2019-08-07: 40 mg via INTRAVENOUS
  Filled 2019-08-07: qty 40

## 2019-08-07 MED ORDER — DIPHENHYDRAMINE HCL 50 MG/ML IJ SOLN: 25.0000 mg | Freq: Four times a day (QID) | INTRAMUSCULAR | Status: DC | PRN

## 2019-08-07 MED ORDER — GUAIFENESIN-DM 100-10 MG/5ML PO SYRP: 10.0000 mL | ORAL_SOLUTION | ORAL | Status: DC | PRN

## 2019-08-07 MED ORDER — ONDANSETRON HCL 4 MG/2ML IJ SOLN
INTRAMUSCULAR | Status: AC
  Filled 2019-08-07: qty 2

## 2019-08-07 MED ORDER — GABAPENTIN 300 MG PO CAPS
300.0000 mg | ORAL_CAPSULE | ORAL | Status: AC
  Administered 2019-08-07: 300 mg via ORAL
  Filled 2019-08-07: qty 1

## 2019-08-07 MED ORDER — HYDROMORPHONE HCL 1 MG/ML IJ SOLN
0.2500 mg | INTRAMUSCULAR | Status: DC | PRN
  Administered 2019-08-07 (×4): 0.5 mg via INTRAVENOUS

## 2019-08-07 MED ORDER — ENOXAPARIN SODIUM 40 MG/0.4ML ~~LOC~~ SOLN
40.0000 mg | SUBCUTANEOUS | Status: DC
  Filled 2019-08-07: qty 0.4

## 2019-08-07 MED ORDER — LIDOCAINE 2% (20 MG/ML) 5 ML SYRINGE
INTRAMUSCULAR | Status: AC
  Filled 2019-08-07: qty 5

## 2019-08-07 MED ORDER — DICYCLOMINE HCL 10 MG/ML IM SOLN
20.0000 mg | Freq: Once | INTRAMUSCULAR | Status: AC
  Administered 2019-08-07: 20 mg via INTRAMUSCULAR
  Filled 2019-08-07: qty 2

## 2019-08-07 MED ORDER — MAGIC MOUTHWASH
15.0000 mL | Freq: Four times a day (QID) | ORAL | Status: DC | PRN
  Filled 2019-08-07: qty 15

## 2019-08-07 MED ORDER — MENTHOL 3 MG MT LOZG: 1.0000 | LOZENGE | OROMUCOSAL | Status: DC | PRN

## 2019-08-07 MED ORDER — ONDANSETRON HCL 4 MG/2ML IJ SOLN: 4.0000 mg | Freq: Four times a day (QID) | INTRAMUSCULAR | Status: DC | PRN

## 2019-08-07 MED ORDER — HYDROMORPHONE HCL 1 MG/ML IJ SOLN
INTRAMUSCULAR | Status: AC
  Administered 2019-08-07: 0.5 mg via INTRAVENOUS
  Filled 2019-08-07: qty 1

## 2019-08-07 MED ORDER — HYDROMORPHONE HCL 1 MG/ML IJ SOLN: 0.5000 mg | INTRAMUSCULAR | Status: DC | PRN

## 2019-08-07 MED ORDER — DEXAMETHASONE SODIUM PHOSPHATE 10 MG/ML IJ SOLN
INTRAMUSCULAR | Status: AC
  Filled 2019-08-07: qty 1

## 2019-08-07 MED ORDER — LORAZEPAM 2 MG/ML IJ SOLN: 0.5000 mg | Freq: Three times a day (TID) | INTRAMUSCULAR | Status: DC | PRN

## 2019-08-07 MED ORDER — SUGAMMADEX SODIUM 200 MG/2ML IV SOLN
INTRAVENOUS | Status: DC | PRN
  Administered 2019-08-07: 400 mg via INTRAVENOUS

## 2019-08-07 MED ORDER — BUPIVACAINE LIPOSOME 1.3 % IJ SUSP
INTRAMUSCULAR | Status: DC | PRN
  Administered 2019-08-07: 20 mL

## 2019-08-07 MED ORDER — BUPIVACAINE HCL (PF) 0.25 % IJ SOLN
INTRAMUSCULAR | Status: AC
  Filled 2019-08-07: qty 60

## 2019-08-07 SURGICAL SUPPLY — 40 items
APPLIER CLIP 5 13 M/L LIGAMAX5 (MISCELLANEOUS) ×3
APPLIER CLIP ROT 10 11.4 M/L (STAPLE)
CABLE HIGH FREQUENCY MONO STRZ (ELECTRODE) ×3 IMPLANT
CLIP APPLIE 5 13 M/L LIGAMAX5 (MISCELLANEOUS) ×1 IMPLANT
CLIP APPLIE ROT 10 11.4 M/L (STAPLE) IMPLANT
COVER MAYO STAND STRL (DRAPES) ×3 IMPLANT
COVER SURGICAL LIGHT HANDLE (MISCELLANEOUS) ×3 IMPLANT
COVER WAND RF STERILE (DRAPES) ×3 IMPLANT
DECANTER SPIKE VIAL GLASS SM (MISCELLANEOUS) ×3 IMPLANT
DRAPE C-ARM 42X120 X-RAY (DRAPES) ×3 IMPLANT
DRAPE UTILITY XL STRL (DRAPES) ×3 IMPLANT
DRAPE WARM FLUID 44X44 (DRAPES) ×3 IMPLANT
DRSG TEGADERM 2-3/8X2-3/4 SM (GAUZE/BANDAGES/DRESSINGS) ×9 IMPLANT
DRSG TEGADERM 4X4.75 (GAUZE/BANDAGES/DRESSINGS) ×3 IMPLANT
ELECT REM PT RETURN 15FT ADLT (MISCELLANEOUS) ×3 IMPLANT
ENDOLOOP SUT PDS II  0 18 (SUTURE) ×2
ENDOLOOP SUT PDS II 0 18 (SUTURE) ×1 IMPLANT
GAUZE SPONGE 2X2 8PLY STRL LF (GAUZE/BANDAGES/DRESSINGS) ×1 IMPLANT
GLOVE ECLIPSE 8.0 STRL XLNG CF (GLOVE) ×3 IMPLANT
GLOVE INDICATOR 8.0 STRL GRN (GLOVE) ×3 IMPLANT
GOWN STRL REUS W/TWL XL LVL3 (GOWN DISPOSABLE) ×6 IMPLANT
IRRIG SUCT STRYKERFLOW 2 WTIP (MISCELLANEOUS) ×3
IRRIGATION SUCT STRKRFLW 2 WTP (MISCELLANEOUS) ×1 IMPLANT
KIT BASIN OR (CUSTOM PROCEDURE TRAY) ×3 IMPLANT
KIT TURNOVER KIT A (KITS) IMPLANT
POUCH RETRIEVAL ECOSAC 10 (ENDOMECHANICALS) ×1 IMPLANT
POUCH RETRIEVAL ECOSAC 10MM (ENDOMECHANICALS) ×2
SCISSORS LAP 5X35 DISP (ENDOMECHANICALS) ×3 IMPLANT
SET CHOLANGIOGRAPH MIX (MISCELLANEOUS) ×3 IMPLANT
SET TUBE SMOKE EVAC HIGH FLOW (TUBING) ×3 IMPLANT
SLEEVE XCEL OPT CAN 5 100 (ENDOMECHANICALS) IMPLANT
SPONGE GAUZE 2X2 STER 10/PKG (GAUZE/BANDAGES/DRESSINGS) ×2
SUT MNCRL AB 4-0 PS2 18 (SUTURE) ×3 IMPLANT
SUT PDS AB 1 CT1 27 (SUTURE) ×12 IMPLANT
SYR 20ML LL LF (SYRINGE) ×3 IMPLANT
TOWEL OR 17X26 10 PK STRL BLUE (TOWEL DISPOSABLE) ×3 IMPLANT
TOWEL OR NON WOVEN STRL DISP B (DISPOSABLE) ×3 IMPLANT
TRAY LAPAROSCOPIC (CUSTOM PROCEDURE TRAY) ×3 IMPLANT
TROCAR BLADELESS OPT 5 100 (ENDOMECHANICALS) ×3 IMPLANT
TROCAR XCEL NON-BLD 11X100MML (ENDOMECHANICALS) ×3 IMPLANT

## 2019-08-07 NOTE — Transfer of Care (Signed)
Immediate Anesthesia Transfer of Care Note  Patient: Melissa Coleman  Procedure(s) Performed: LAPAROSCOPIC CHOLECYSTECTOMY WITH INTRAOPERATIVE CHOLANGIOGRAM (N/A Abdomen)  Patient Location: PACU  Anesthesia Type:General  Level of Consciousness: awake and patient cooperative  Airway & Oxygen Therapy: Patient Spontanous Breathing and Patient connected to face mask  Post-op Assessment: Report given to RN and Post -op Vital signs reviewed and stable  Post vital signs: Reviewed and stable  Last Vitals:  Vitals Value Taken Time  BP 129/75 08/07/19 1655  Temp    Pulse 88 08/07/19 1656  Resp 16 08/07/19 1656  SpO2 92 % 08/07/19 1656  Vitals shown include unvalidated device data.  Last Pain:  Vitals:   08/07/19 1447  TempSrc: Oral  PainSc:          Complications: No apparent anesthesia complications

## 2019-08-07 NOTE — ED Notes (Signed)
Patient stated that the med that she got earlier makes the pain bearable.  She will be transported by POV by her daughter.

## 2019-08-07 NOTE — Op Note (Signed)
08/07/2019  PATIENT:  Melissa Coleman  43 y.o. female  Patient Care Team: Patient, No Pcp Per as PCP - General (General Practice)  PRE-OPERATIVE DIAGNOSIS:    Acute on Chronic Calculus Cholecystitis  POST-OPERATIVE DIAGNOSIS:   Acute on Chronic Calculus Cholecystitis  Liver: Fatty steatohepatitis  PROCEDURE:  Laparoscopic cholecystectomy with intraoperative cholangiogram  SURGEON:  Adin Hector, MD, FACS.  ASSISTANT: Alferd Apa, PA-C   ANESTHESIA:    General with endotracheal intubation Local anesthetic as a field block  EBL:  (See Anesthesia Intraoperative Record) Total I/O In: -  Out: 20 [Blood:20]   Delay start of Pharmacological VTE agent (>24hrs) due to surgical blood loss or risk of bleeding:  no  DRAINS: None   SPECIMEN: Gallbladder    DISPOSITION OF SPECIMEN:  PATHOLOGY  COUNTS:  YES  PLAN OF CARE: Admit for overnight observation  PATIENT DISPOSITION:  PACU - hemodynamically stable.  INDICATION: Pleasant morbidly obese woman with episodes of intermittent abdominal pain usually not too severe.  Unfortunate she woke up at 11:00 last night with severe pain.  Murphy sign.  Examination and work-up concerning for cholecystitis.  The rest of the differential diagnosis seems unlikely.  Transferred from [place] emergency room to [facility].  Surgery recommended  The anatomy & physiology of hepatobiliary & pancreatic function was discussed.  The pathophysiology of gallbladder dysfunction was discussed.  Natural history risks without surgery was discussed.   I feel the risks of no intervention will lead to serious problems that outweigh the operative risks; therefore, I recommended cholecystectomy to remove the pathology.  I explained laparoscopic techniques with possible need for an open approach.  Probable cholangiogram to evaluate the bilary tract was explained as well.    Risks such as bleeding, infection, abscess, leak, injury to other organs,  need for further treatment, heart attack, death, and other risks were discussed.  I noted a good likelihood this will help address the problem.  Possibility that this will not correct all abdominal symptoms was explained.  Goals of post-operative recovery were discussed as well.  We will work to minimize complications.  An educational handout further explaining the pathology and treatment options was given as well.  Questions were answered.  The patient expresses understanding & wishes to proceed with surgery.  OR FINDINGS: Pale thickened gallbladder wall with adhesions consistent with chronic cholecystitis.  Acute inflammation and edema consistent with acute cholecystitis  Cholangiogram with narrowed but classic biliary system.  No evidence of any leak or obstruction.  No choledocholithiasis.  Liver: Fatty steatohepatitis  DESCRIPTION:   Informed consent was confirmed.  The patient underwent general anaesthesia without difficulty.  The patient was positioned appropriately.  VTE prevention in place.  The patient's abdomen was clipped, prepped, & draped in a sterile fashion.  Surgical timeout confirmed our plan.  Peritoneal entry with a laparoscopic port was obtained using optical entry technique in the right upper abdomen as the patient was positioned in reverse Trendelenburg.  Entry was clean.  I induced carbon dioxide insufflation.  Camera inspection revealed no injury.  Extra ports were carefully placed under direct laparoscopic visualization.  I turned attention to the right upper quadrant.  Adhesions of greater omentum were noted to the gallbladder.  Gallbladder was distended with edema with acute on chronic adhesions consistent with acute on chronic cholecystitis.  I worked to free the greater omentum off of the dome of the gallbladder to better expose it.  The gallbladder fundus was elevated cephalad.  I used hook cautery and blunt hydrodissection to free the peritoneal coverings between the  gallbladder and the liver on the posteriolateral and anteriomedial walls.   I used careful blunt and hook dissection to help get a good critical view of the cystic artery and cystic duct. I did further dissection to free 80% of the gallbladder off the liver bed to get a good critical view of the infundibulum and cystic duct.  I dissected out the cystic artery; and, after getting a good 360 view, ligated the anterior & posterior branches of the cystic artery close on the infundibulum blunt [place] and focused hook dissection.  I skeletonized the cystic duct.  I placed a clip on the infundibulum. I did a partial cystic duct-otomy and ensured patency. I placed a 5 [place] cholangiocatheter through a puncture site at the right subcostal ridge of the abdominal wall and directed it into the cystic duct.  We ran a cholangiogram with dilute radio-opaque contrast and continuous fluoroscopy. Contrast flowed from a side branch consistent with cystic duct cannulization. Contrast flowed up the common hepatic duct into the right and left intrahepatic chains out to secondary radicals. Contrast flowed down the common bile duct easily across the normal ampulla into the duodenum.  This was consistent with a normal cholangiogram.  I removed the cholangiocatheter.  I freed the gallbladder from some remaining attachments on the liver bed.  I placed a 0 PDS Endoloop around the gallbladder and lassoed the base of the cystic duct for a good ligation given the edema and inflammation in the region.   I placed clips on the cystic duct x3.   I completed cystic duct transection. I freed the gallbladder from its remaining attachments to the liver. I ensured hemostasis on the gallbladder fossa of the liver and elsewhere. I inspected the rest of the abdomen & detected no injury nor bleeding elsewhere.  Placed the gallbladder inside and eco-sac bag.  I removed the gallbladder out the subxiphoid port site.  I did have to open the fascia to 3  cm to get it out with its large stones and thickened wall.  I closed the subxiphoid fascia transversely using #1 PDS interrupted stitches under laparoscopic guidance.. I closed the skin using 4-0 monocryl stitch.  Sterile dressings were applied. The patient was extubated & arrived in the PACU in stable condition..  I had discussed postoperative care with the patient in the holding area. I discussed operative findings, updated the patient's status, discussed probable steps to recovery, and gave postoperative recommendations to the patient's family.  Recommendations were made.  Questions were answered.  They expressed understanding & appreciation.  Ardeth Sportsman, M.D., F.A.C.S. Gastrointestinal and Minimally Invasive Surgery [facility] [address] [place], [place] [address] [phone] Main / Paging  08/07/2019 4:46 PM

## 2019-08-07 NOTE — Anesthesia Postprocedure Evaluation (Signed)
Anesthesia Post Note  Patient: Melissa Coleman  Procedure(s) Performed: LAPAROSCOPIC CHOLECYSTECTOMY WITH INTRAOPERATIVE CHOLANGIOGRAM (N/A Abdomen)     Patient location during evaluation: PACU Anesthesia Type: General Level of consciousness: awake and alert Pain management: pain level controlled Vital Signs Assessment: post-procedure vital signs reviewed and stable Respiratory status: spontaneous breathing, nonlabored ventilation, respiratory function stable and patient connected to nasal cannula oxygen Cardiovascular status: blood pressure returned to baseline and stable Postop Assessment: no apparent nausea or vomiting Anesthetic complications: no    Last Vitals:  Vitals:   08/07/19 1715 08/07/19 1730  BP: 134/82 129/82  Pulse: 90 86  Resp: 14 15  Temp:    SpO2: 95% 93%    Last Pain:  Vitals:   08/07/19 1715  TempSrc:   PainSc: 5                  Helmer Dull,W. EDMOND

## 2019-08-07 NOTE — ED Provider Notes (Signed)
43 year old female arrives from [facility], ultrasound concerning for acute cholecystitis, general surgery consulted and to see patient.  Beta-hCG negative Troponin less than 2 Lipase within normal limits CBC with leukocytosis 12.4, hemoglobin 15.2 CMP nonacute EKG without acute findings reviewed by Dr. Nicanor Alcon  DG chest:  IMPRESSION:  No evidence of active disease.   CTAP:  IMPRESSION:  Cholelithiasis and mild gallbladder wall thickening. If symptoms  could be related to cholecystitis, recommend sonographic follow-up.   RUQ [place]:  IMPRESSION:  Findings suspicious for acute cholecystitis.    Diffuse increased echotexture of the liver, nonspecific but can be  seen in fatty infiltration of liver.    Patient arrives well-appearing and in no acute distress states improvement of pain after medications given in previous ER, she is no questions or concerns at this time.  Consult called to general surgery to inform them that patient has arrived.  2 g Rocephin ordered for acute cholecystitis. Physical Exam  BP (!) 133/99 (BP Location: Right Arm)   Pulse 92   Temp 98.4 F (36.9 C) (Oral)   Resp 14   Ht 5\' 4"  (1.626 m)   Wt (!) 140.6 kg   LMP 07/09/2019 (Within Days)   SpO2 96%   BMI 53.21 kg/m   Physical Exam Constitutional:      General: She is not in acute distress.    Appearance: Normal appearance. She is well-developed. She is obese. She is not ill-appearing or diaphoretic.  HENT:     Head: Normocephalic and atraumatic.     Right Ear: External ear normal.     Left Ear: External ear normal.     Nose: Nose normal.  Eyes:     General: Vision grossly intact. Gaze aligned appropriately.     Pupils: Pupils are equal, round, and reactive to light.  Neck:     Musculoskeletal: Normal range of motion.     Trachea: Trachea and phonation normal. No tracheal deviation.  Pulmonary:     Effort: Pulmonary effort is normal. No respiratory distress.  Musculoskeletal: Normal  range of motion.  Skin:    General: Skin is warm and dry.  Neurological:     Mental Status: She is alert.     GCS: GCS eye subscore is 4. GCS verbal subscore is 5. GCS motor subscore is 6.     Comments: Speech is clear and goal oriented, follows commands Major Cranial nerves without deficit, no facial droop Moves extremities without ataxia, coordination intact  Psychiatric:        Behavior: Behavior normal.    ED Course/Procedures   Clinical Course as of Aug 06 1137  Fri Aug 07, 2019  Aug 09, 2019 Creatinine: 0.76 [MT]  0940 Will page surgery   [MT]  1016 I spoke to Dr. [id number] PA Gordy Savers at [facility] who advised that the patient be transferred ED to ED for evaluation of her there.  She advised that the patient remain n.p.o.  Will possibly take her to the OR for cholecystectomy later today.  This was discussed with the patient.  She states she has a ride to come get her.  I believe she is stable to go by private vehicle.  Advised her to go directly to [facility] emergency department.   [MT]  1024 Patient is able to ambulate here.  She reports her pain is 5 out of 10 but under control.  She is afebrile and her vitals are stable.  I believe there is reasonable for her to  go by private vehicle directly to [facility].    [MT]    Clinical Course User Index [MT] Trifan, Carola Rhine, MD    Procedures  MDM  12:50 PM: Discussed case in person with general surgery PA Maczis, agrees with 2 g IV Rocephin, as the patient remained n.p.o. and that we order the rapid COVID-19 test today as they plan to take patient to the OR soon.  Patient had 6-24-hour COVID test ordered at previous ER, will reorder at the rapid test now. - COVID-19 negative - 2:30 PM: Patient updated of call with test results.  On reassessment she is well-appearing and in no acute distress reports that she feels well.  Patient awaiting OR.  Note: Portions of this report may have been transcribed using voice recognition software.  Every effort was made to ensure accuracy; however, inadvertent computerized transcription errors may still be present.   Deliah [place], PA-C 08/07/19 1431    Varney Biles, MD 08/07/19 1514

## 2019-08-07 NOTE — Anesthesia Procedure Notes (Signed)
Procedure Name: Intubation Date/Time: 08/07/2019 3:17 PM Performed by: Claudia Desanctis, CRNA Pre-anesthesia Checklist: Patient identified, Emergency Drugs available, Suction available and Patient being monitored Patient Re-evaluated:Patient Re-evaluated prior to induction Oxygen Delivery Method: Circle system utilized Preoxygenation: Pre-oxygenation with 100% oxygen Induction Type: IV induction Ventilation: Mask ventilation without difficulty Laryngoscope Size: Glidescope and 4 Grade View: Grade I Tube type: Oral Number of attempts: 1 Airway Equipment and Method: Stylet Placement Confirmation: ETT inserted through vocal cords under direct vision,  positive ETCO2 and breath sounds checked- equal and bilateral Secured at: 22 cm Tube secured with: Tape Dental Injury: Teeth and Oropharynx as per pre-operative assessment  Comments: glidescope used electively

## 2019-08-07 NOTE — ED Provider Notes (Signed)
[facility] EMERGENCY DEPARTMENT Provider Note   CSN: [id number] Arrival date & time: 08/07/19  [id number]     History   Chief Complaint Chief Complaint  Patient presents with  . Chest Pain    R upper abd radiating to R shoulder  . Abdominal Pain    HPI Melissa Coleman is a 43 y.o. female.     The history is provided by the patient.  Abdominal Pain Pain location:  RUQ Pain quality: sharp and shooting   Pain radiates to:  R shoulder Pain severity:  Severe Onset quality:  Gradual Duration:  6 hours Timing:  Constant Progression:  Unchanged Chronicity:  New Context: eating   Context comment:  [employer] sandwich Relieved by:  Nothing Worsened by:  Nothing Ineffective treatments:  None tried Associated symptoms: nausea and vomiting   Associated symptoms: no anorexia, no belching, no chest pain, no chills, no constipation, no cough, no diarrhea, no dysuria, no fatigue, no fever, no flatus, no hematemesis, no hematochezia, no hematuria, no melena, no shortness of breath, no sore throat, no vaginal bleeding and no vaginal discharge   Patient with depression and hyperlipidemia who presents with RUQ pain that radiates to the right shoulder with associated n/v following eating a subway sandwich this evening.  No f/c/r.  No cough.  Has been having n/v/d this week.    Past Medical History:  Diagnosis Date  . Crohn's colitis (HCC)     There are no active problems to display for this patient.   Past Surgical History:  Procedure Laterality Date  . ROOT CANAL    . TONSILLECTOMY       OB History   No obstetric history on file.      Home Medications    Prior to Admission medications   Medication Sig Start Date End Date Taking? Authorizing Provider  acetaminophen (TYLENOL) 325 MG tablet Take 650 mg by mouth every 6 (six) hours as needed.    [provider]  amoxicillin (AMOXIL) 500 MG capsule Take 2 capsules (1,000 mg total) by mouth 3 (three) times daily.  08/29/12   Vanetta MuldersZackowski, Scott, MD  HYDROcodone-acetaminophen (NORCO) 5-325 MG per tablet Take 1 tablet by mouth every 6 (six) hours as needed for severe pain. 05/10/14   Toy Cookeyocherty, Megan, MD  naproxen (NAPROSYN) 500 MG tablet Take 1 tablet (500 mg total) by mouth 2 (two) times daily. 08/29/12   Vanetta MuldersZackowski, Scott, MD  penicillin v potassium (VEETID) 500 MG tablet Take 1 tablet (500 mg total) by mouth 3 (three) times daily. 04/28/13   Margarita Grizzleay, Danielle, MD  penicillin v potassium (VEETID) 500 MG tablet Take 1 tablet (500 mg total) by mouth 3 (three) times daily. 05/10/14   Toy Cookeyocherty, Megan, MD    Family History No family history on file.  Social History Social History   Tobacco Use  . Smoking status: Current Every Day Smoker    Packs/day: 0.50    Types: Cigarettes  Substance Use Topics  . Alcohol use: Yes    Comment: occasional  . Drug use: No     Allergies   Flonase [fluticasone propionate]   Review of Systems Review of Systems  Constitutional: Negative for chills, fatigue and fever.  HENT: Negative for sore throat.   Eyes: Negative for visual disturbance.  Respiratory: Negative for cough and shortness of breath.   Cardiovascular: Negative for chest pain.  Gastrointestinal: Positive for abdominal pain, nausea and vomiting. Negative for anorexia, constipation, diarrhea, flatus, hematemesis, hematochezia and melena.  Genitourinary: Negative for dysuria, hematuria, vaginal bleeding and vaginal discharge.  Skin: Negative for rash.  Neurological: Negative for dizziness.  Psychiatric/Behavioral: Negative for agitation.  All other systems reviewed and are negative.    Physical Exam Updated Vital Signs Ht 5\' 4"  (1.626 m)   Wt (!) 140.6 kg   LMP 07/09/2019 (Within Days)   BMI 53.21 kg/m   Physical Exam Vitals signs and nursing note reviewed.  Constitutional:      Appearance: She is obese. She is not toxic-appearing.  HENT:     Head: Normocephalic and atraumatic.     Nose: Nose  normal.  Eyes:     Conjunctiva/sclera: Conjunctivae normal.     Pupils: Pupils are equal, round, and reactive to light.  Neck:     Musculoskeletal: Normal range of motion and neck supple.  Cardiovascular:     Rate and Rhythm: Normal rate and regular rhythm.     Pulses: Normal pulses.     Heart sounds: Normal heart sounds.  Pulmonary:     Effort: Pulmonary effort is normal.     Breath sounds: Normal breath sounds.  Abdominal:     General: Abdomen is flat. Bowel sounds are normal.     Palpations: Abdomen is soft.     Tenderness: There is abdominal tenderness. There is no rebound. Positive signs include Murphy's sign. Negative signs include McBurney's sign.     Hernia: No hernia is present.  Musculoskeletal: Normal range of motion.  Skin:    General: Skin is warm and dry.     Capillary Refill: Capillary refill takes less than 2 seconds.  Neurological:     General: No focal deficit present.     Mental Status: She is alert and oriented to person, place, and time.  Psychiatric:        Mood and Affect: Mood normal.        Behavior: Behavior normal.      ED Treatments / Results  Labs (all labs ordered are listed, but only abnormal results are displayed) Results for orders placed or performed during the hospital encounter of 08/07/19  CBC with Differential/Platelet  Result Value Ref Range   WBC 12.4 (H) 4.0 - 10.5 K/uL   RBC 5.26 (H) 3.87 - 5.11 MIL/uL   Hemoglobin 15.2 (H) 12.0 - 15.0 g/dL   HCT 47.1 (H) 36.0 - 46.0 %   MCV 89.5 80.0 - 100.0 fL   MCH 28.9 26.0 - 34.0 pg   MCHC 32.3 30.0 - 36.0 g/dL   RDW 12.7 11.5 - 15.5 %   Platelets 270 150 - 400 K/uL   nRBC 0.0 0.0 - 0.2 %   Neutrophils Relative % 79 %   Neutro Abs 9.8 (H) 1.7 - 7.7 K/uL   Lymphocytes Relative 16 %   Lymphs Abs 2.0 0.7 - 4.0 K/uL   Monocytes Relative 4 %   Monocytes Absolute 0.5 0.1 - 1.0 K/uL   Eosinophils Relative 0 %   Eosinophils Absolute 0.0 0.0 - 0.5 K/uL   Basophils Relative 1 %   Basophils  Absolute 0.1 0.0 - 0.1 K/uL   Immature Granulocytes 0 %   Abs Immature Granulocytes 0.04 0.00 - 0.07 K/uL  Pregnancy, urine  Result Value Ref Range   Preg Test, Ur NEGATIVE NEGATIVE  Comprehensive metabolic panel  Result Value Ref Range   Sodium 138 135 - 145 mmol/L   Potassium 4.0 3.5 - 5.1 mmol/L   Chloride 102 98 - 111 mmol/L   CO2 23  22 - 32 mmol/L   Glucose, Bld 146 (H) 70 - 99 mg/dL   BUN 13 6 - 20 mg/dL   Creatinine, Ser 1.95 0.44 - 1.00 mg/dL   Calcium 9.5 8.9 - 09.3 mg/dL   Total Protein 7.6 6.5 - 8.1 g/dL   Albumin 4.3 3.5 - 5.0 g/dL   AST 24 15 - 41 U/L   ALT 23 0 - 44 U/L   Alkaline Phosphatase 70 38 - 126 U/L   Total Bilirubin 0.7 0.3 - 1.2 mg/dL   GFR calc non Af Amer >60 >60 mL/min   GFR calc Af Amer >60 >60 mL/min   Anion gap 13 5 - 15  Lipase, blood  Result Value Ref Range   Lipase 26 11 - 51 U/L  Troponin I (High Sensitivity)  Result Value Ref Range   Troponin I (High Sensitivity) <2 <18 ng/L   No results found.  EKG EKG Interpretation  Date/Time:  Friday August 07 2019 05:33:48 EDT Ventricular Rate:  84 PR Interval:    QRS Duration: 114 QT Interval:  374 QTC Calculation: 445 R Axis:   109 Text Interpretation:  Sinus rhythm Baseline wander in lead(s) V2 Confirmed by Nicanor Alcon, Goldia Ligman ([id number]) on 08/07/2019 5:36:28 AM   Radiology No results found.  Procedures Procedures (including critical care time)  Medications Ordered in ED Medications  fentaNYL (SUBLIMAZE) injection 100 mcg (100 mcg Intravenous Given 08/07/19 0543)  dicyclomine (BENTYL) injection 20 mg (20 mg Intramuscular Given 08/07/19 0546)  ondansetron (ZOFRAN) injection 4 mg (4 mg Intravenous Given 08/07/19 0546)  morphine 4 MG/ML injection 4 mg (4 mg Intravenous Given 08/07/19 0622)  iohexol (OMNIPAQUE) 300 MG/ML solution 100 mL (100 mLs Intravenous Contrast Given 08/07/19 0631)     I do not believe this is cardiac.  I believe this is abdominal pain that radiates to the shoulder.   Patient is NPO pending results.  Signed out to Dr. Renaye Rakers  Final Clinical Impressions(s) / ED Diagnoses      Michoel Kunin, MD 08/07/19 4580

## 2019-08-07 NOTE — ED Notes (Signed)
Patient transported to CT 

## 2019-08-07 NOTE — H&P (Signed)
Melissa Coleman 02-23-1976  [id number].    Requesting MD: Dr. Rhunette Croft  Chief Complaint: RUQ abdominal pain, N/V Reason for Consult: Cholelithiasis with possible acute Cholecystitis   HPI: Melissa Coleman is a a 43 y.o. female with morbid obesity and a documented hx of Crohn's on her chart who presented to [facility] yesterday with right upper quadrant abdominal pain, nausea and vomiting.  Patient reports that yesterday afternoon she ate a coldcut sandwich around 6 PM and then an evening snack of graham crackers with melted chocolate.  Around 1115pm she began having sharp, right upper quadrant abdominal pain that radiated to her right scapula and was associate with nausea as well as greater than 5 episodes of emesis.  She denies associated fever, chills, diarrhea. Her pain has been waxing and waning since onset but has never resolved despite Fentanyl and Morphine in the ED.  Patient notes that symptoms were worse wth palpation over the area of pain and bending over.  Symptoms are nonexertional and not associated with chest pain or shortness of breath.  She reports that she has had similar symptoms of right upper quadrant postprandial abdominal pain with associated nausea and vomiting in the past, approximately 5-7 times the last 1.5 years, but is never lasted this long or been this severe. She denies symptoms of acid reflux.  Work-up in the ED revealed leukocytosis of 12,400.  LFTs within normal limits.  T bili 0.7.  Lipase 26.  CT abdomen & pelvis showed cholelithiasis and mild gallbladder wall thickening's without any other acute intra-abdominal pathology.  Right upper quadrant ultrasound showed multiple gallstones in the gallbladder with a mildly thickened gallbladder wall measuring 3.4 cm and a positive sonographic Murphy sign.  CBD 5.9 mm.  General surgery was consulted for evaluation.  Patient reports she does not take any daily medications. No other medical hx then as mentioned above. She  did follow with a PCP until February of this year when she lost her insurance. She denies previous abdominal surgery.  She currently lives at home alone.  She has 2 children in college.  She smokes about 1/2 pack of cigarettes per day.  She drinks alcohol a few times per week.  No illicit drug use.  She is not on any blood thinners. She has been NPO since 11pm last night.  ROS: Review of Systems  Constitutional: Positive for diaphoresis. Negative for chills and fever.  Respiratory: Negative for cough and shortness of breath.   Cardiovascular: Negative for chest pain.  Gastrointestinal: Positive for abdominal pain, nausea and vomiting. Negative for constipation and diarrhea.  Genitourinary: Negative for dysuria.  Musculoskeletal: Negative for myalgias.  All other systems reviewed and are negative.   No family history on file.  Past Medical History:  Diagnosis Date   Crohn's colitis (HCC)    states "light chrohns" - tested negative for crohns previously    Past Surgical History:  Procedure Laterality Date   ROOT CANAL     TONSILLECTOMY      Social History:  reports that she has been smoking cigarettes. She has been smoking about 0.50 packs per day. She has never used smokeless tobacco. She reports current alcohol use. She reports that she does not use drugs.  Allergies:  Allergies  Allergen Reactions   Flonase [Fluticasone Propionate] Swelling   Iodine     Topical application  - skin burning    (Not in a hospital admission)    Physical Exam: Blood  pressure 122/79, pulse 83, temperature 98.4 F (36.9 C), temperature source Oral, resp. rate 15, height 5\' 4"  (1.626 m), weight (!) 140.6 kg, last menstrual period 07/09/2019, SpO2 93 %. General: pleasant, WD, WN obese female who is laying in bed in NAD HEENT: head is normocephalic, atraumatic.  Sclera are noninjected. No scleral icterus.  PERRL.  Ears and nose without any masses or lesions.  Mouth is pink and moist Heart:  regular, rate, and rhythm.  Normal s1,s2. No obvious murmurs, gallops, or rubs noted.  Palpable radial and pedal pulses bilaterally Lungs: CTAB, no wheezes, rhonchi, or rales noted.  Respiratory effort nonlabored Abd: Obese, soft, ND, tenderness of the RUQ without peritonitis. +BS, no masses, hernias, or organomegaly. No prior abdominal surgical scars.  MS: all 4 extremities are symmetrical with no cyanosis, clubbing, or edema. Skin: warm and dry with no masses, lesions, or rashes Neuro: A&O x 3. Moves all extremities.  Psych: Alert with an appropriate affect.   Results for orders placed or performed during the hospital encounter of 08/07/19 (from the past 48 hour(s))  CBC with Differential/Platelet     Status: Abnormal   Collection Time: 08/07/19  5:50 AM  Result Value Ref Range   WBC 12.4 (H) 4.0 - 10.5 K/uL   RBC 5.26 (H) 3.87 - 5.11 MIL/uL   Hemoglobin 15.2 (H) 12.0 - 15.0 g/dL   HCT 16.147.1 (H) 09.636.0 - 04.546.0 %   MCV 89.5 80.0 - 100.0 fL   MCH 28.9 26.0 - 34.0 pg   MCHC 32.3 30.0 - 36.0 g/dL   RDW 40.912.7 81.111.5 - 91.415.5 %   Platelets 270 150 - 400 K/uL   nRBC 0.0 0.0 - 0.2 %   Neutrophils Relative % 79 %   Neutro Abs 9.8 (H) 1.7 - 7.7 K/uL   Lymphocytes Relative 16 %   Lymphs Abs 2.0 0.7 - 4.0 K/uL   Monocytes Relative 4 %   Monocytes Absolute 0.5 0.1 - 1.0 K/uL   Eosinophils Relative 0 %   Eosinophils Absolute 0.0 0.0 - 0.5 K/uL   Basophils Relative 1 %   Basophils Absolute 0.1 0.0 - 0.1 K/uL   Immature Granulocytes 0 %   Abs Immature Granulocytes 0.04 0.00 - 0.07 K/uL    Comment: Performed at [facility] [place], [address] [place], [place] [address]  Troponin I (High Sensitivity)     Status: None   Collection Time: 08/07/19  5:50 AM  Result Value Ref Range   Troponin I (High Sensitivity) <2 <18 ng/L    Comment: (NOTE) Elevated high sensitivity troponin I (hsTnI) values and significant  changes across serial measurements may suggest ACS but many other  chronic and acute  conditions are known to elevate hsTnI results.  Refer to the Links section for chest pain algorithms and additional  guidance. Performed at [facility] [place], [address] [place], [place] [address]   Comprehensive metabolic panel     Status: Abnormal   Collection Time: 08/07/19  5:50 AM  Result Value Ref Range   Sodium 138 135 - 145 mmol/L   Potassium 4.0 3.5 - 5.1 mmol/L    Comment: SLIGHT HEMOLYSIS   Chloride 102 98 - 111 mmol/L   CO2 23 22 - 32 mmol/L   Glucose, Bld 146 (H) 70 - 99 mg/dL   BUN 13 6 - 20 mg/dL   Creatinine, Ser 8.650.76 0.44 - 1.00 mg/dL   Calcium 9.5 8.9 - 78.410.3 mg/dL  Total Protein 7.6 6.5 - 8.1 g/dL   Albumin 4.3 3.5 - 5.0 g/dL   AST 24 15 - 41 U/L   ALT 23 0 - 44 U/L   Alkaline Phosphatase 70 38 - 126 U/L   Total Bilirubin 0.7 0.3 - 1.2 mg/dL   GFR calc non Af Amer >60 >60 mL/min   GFR calc Af Amer >60 >60 mL/min   Anion gap 13 5 - 15    Comment: Performed at [facility], [address] [place], [place] [address]  Lipase, blood     Status: None   Collection Time: 08/07/19  5:50 AM  Result Value Ref Range   Lipase 26 11 - 51 U/L    Comment: Performed at [facility], [address] [place], [place] [address]  Pregnancy, urine     Status: None   Collection Time: 08/07/19  6:04 AM  Result Value Ref Range   Preg Test, Ur NEGATIVE NEGATIVE    Comment:        THE SENSITIVITY OF THIS METHODOLOGY IS >20 mIU/mL. Performed at [facility], [address] [place], [place] [address]    Ct Abdomen Pelvis W Contrast  Result Date: 08/07/2019 CLINICAL DATA:  Right upper quadrant pain radiating to the shoulder EXAM: CT ABDOMEN AND PELVIS WITH CONTRAST TECHNIQUE: Multidetector CT imaging of the abdomen and pelvis was performed using the standard protocol following bolus administration of intravenous contrast. CONTRAST:  161mL OMNIPAQUE IOHEXOL 300 MG/ML  SOLN COMPARISON:  04/05/2008 FINDINGS: Lower chest:  No contributory  findings. Hepatobiliary: No focal liver abnormality.Three calcified gallstones. Mild gallbladder wall thickening. No pericholecystic inflammation or gallbladder over distension. No bile duct dilatation. Pancreas: Unremarkable. Spleen: Unremarkable. Adrenals/Urinary Tract: Negative adrenals. No hydronephrosis or stone. Unremarkable bladder. Stomach/Bowel: No obstruction. No appendicitis. Fecalized distal ileal contents, considered incidental. Vascular/Lymphatic: No acute vascular abnormality. No mass or adenopathy. Reproductive:No pathologic findings. Other: No ascites or pneumoperitoneum. Musculoskeletal: No acute abnormalities. Degenerative endplate spurring at multiple levels IMPRESSION: Cholelithiasis and mild gallbladder wall thickening. If symptoms could be related to cholecystitis, recommend sonographic follow-up. Electronically Signed   By: Monte Fantasia M.D.   On: 08/07/2019 07:27   Dg Chest Portable 1 View  Result Date: 08/07/2019 CLINICAL DATA:  Right upper abdominal pain EXAM: PORTABLE CHEST 1 VIEW COMPARISON:  01/11/2010 FINDINGS: Normal heart size and mediastinal contours. There is no edema, consolidation, effusion, or pneumothorax. Artifact from EKG leads. IMPRESSION: No evidence of active disease. Electronically Signed   By: Monte Fantasia M.D.   On: 08/07/2019 07:10   US Abdomen Limited Ruq  Result Date: 08/07/2019 CLINICAL DATA:  Right abdomen pain starting this morning EXAM: ULTRASOUND ABDOMEN LIMITED RIGHT UPPER QUADRANT COMPARISON:  April 05, 2008 FINDINGS: Gallbladder: Gallstones are identified in the gallbladder. The gallbladder wall is mildly thickened measuring 3.4 mm. The ultrasonographer reports positive sonographic Murphy sign. There is a 3.1 mm polyp in the gallbladder. Common bile duct: Diameter: 5.9 mm Liver: No focal liver lesion. Diffuse increased echotexture of the liver is noted. Portal vein is patent on color Doppler imaging with normal direction of blood flow towards the  liver. Other: None. IMPRESSION: Findings suspicious for acute cholecystitis. Diffuse increased echotexture of the liver, nonspecific but can be seen in fatty infiltration of liver. Electronically Signed   By: Abelardo Diesel M.D.   On: 08/07/2019 09:21   Anti-infectives (From admission, onward)   Start     Dose/Rate Route Frequency  Ordered Stop   08/07/19 1300  cefTRIAXone (ROCEPHIN) 2 g in sodium chloride 0.9 % 100 mL IVPB     2 g 200 mL/hr over 30 Minutes Intravenous On call to O.R. 08/07/19 1242 08/08/19 0559   08/07/19 1300  metroNIDAZOLE (FLAGYL) IVPB 500 mg     500 mg 100 mL/hr over 60 Minutes Intravenous On call to O.R. 08/07/19 1242 08/08/19 0559   08/07/19 1245  cefTRIAXone (ROCEPHIN) 2 g in sodium chloride 0.9 % 100 mL IVPB  Status:  Discontinued     2 g 200 mL/hr over 30 Minutes Intravenous  Once 08/07/19 1240 08/07/19 1246      Assessment/Plan Symptomatic Cholelithiasis with likely early Acute Cholecystitis  - Will plan for OR today for Laparoscopic Cholecystectomy pending COVID test is negative. I have explained the procedure, risks, and aftercare of cholecystectomy.  Risks include but are not limited to bleeding, infection, wound problems, anesthesia, diarrhea, bile leak, injury to common bile duct/liver/intestine.  She seems to understand and agrees to proceed.  FEN - NPO VTE - SCDs, mobilize etc ID - Rocephin/Flagyl  Jacinto Halim, [facility] 08/07/2019, 1:13 PM Pager: [phone]

## 2019-08-07 NOTE — Interval H&P Note (Signed)
History and Physical Interval Note:  08/07/2019 3:07 PM  Melissa Coleman  has presented today for surgery, with the diagnosis of cholecystitis.  The various methods of treatment have been discussed with the patient and family. After consideration of risks, benefits and other options for treatment, the patient has consented to  Procedure(s): LAPAROSCOPIC CHOLECYSTECTOMY WITH INTRAOPERATIVE CHOLANGIOGRAM (N/A) as a surgical intervention.  The patient's history has been reviewed, patient examined, no change in status, stable for surgery.  I have reviewed the patient's chart and labs.  Questions were answered to the patient's satisfaction.    I have re-reviewed the the patient's records, history, medications, and allergies.  I have re-examined the patient.  I again discussed intraoperative plans and goals of post-operative recovery.  The patient agrees to proceed.  Melissa Coleman  08/03/1976 [id number]  Patient Care Team: Patient, No Pcp Per as PCP - General (General Practice)  There are no active problems to display for this patient.   Past Medical History:  Diagnosis Date  . Crohn's colitis (HCC)    states "light chrohns" - tested negative for crohns previously    Past Surgical History:  Procedure Laterality Date  . ROOT CANAL    . TONSILLECTOMY      Social History   Socioeconomic History  . Marital status: Single    Spouse name: Not on file  . Number of children: Not on file  . Years of education: Not on file  . Highest education level: Not on file  Occupational History  . Not on file  Social Needs  . Financial resource strain: Not on file  . Food insecurity    Worry: Not on file    Inability: Not on file  . Transportation needs    Medical: Not on file    Non-medical: Not on file  Tobacco Use  . Smoking status: Current Every Day Smoker    Packs/day: 0.50    Types: Cigarettes  . Smokeless tobacco: Never Used  Substance and Sexual Activity  . Alcohol use: Yes   Comment: occasional  . Drug use: No  . Sexual activity: Yes    Birth control/protection: I.U.D.  Lifestyle  . Physical activity    Days per week: Not on file    Minutes per session: Not on file  . Stress: Not on file  Relationships  . Social Musician on phone: Not on file    Gets together: Not on file    Attends religious service: Not on file    Active member of club or organization: Not on file    Attends meetings of clubs or organizations: Not on file    Relationship status: Not on file  . Intimate partner violence    Fear of current or ex partner: Not on file    Emotionally abused: Not on file    Physically abused: Not on file    Forced sexual activity: Not on file  Other Topics Concern  . Not on file  Social History Narrative  . Not on file    History reviewed. No pertinent family history.  Medications Prior to Admission  Medication Sig Dispense Refill Last Dose  . amoxicillin (AMOXIL) 500 MG capsule Take 2 capsules (1,000 mg total) by mouth 3 (three) times daily. 30 capsule 0   . HYDROcodone-acetaminophen (NORCO) 5-325 MG per tablet Take 1 tablet by mouth every 6 (six) hours as needed for severe pain. 6 tablet 0   . naproxen (NAPROSYN) 500  MG tablet Take 1 tablet (500 mg total) by mouth 2 (two) times daily. (Patient not taking: Reported on 08/07/2019) 14 tablet 0 Not Taking at Unknown time  . penicillin v potassium (VEETID) 500 MG tablet Take 1 tablet (500 mg total) by mouth 3 (three) times daily. 30 tablet 0   . penicillin v potassium (VEETID) 500 MG tablet Take 1 tablet (500 mg total) by mouth 3 (three) times daily. 30 tablet 0     Current Facility-Administered Medications  Medication Dose Route Frequency Provider Last Rate Last Dose  . bupivacaine liposome (EXPAREL) 1.3 % injection 266 mg  20 mL Infiltration Once Karie Soda, MD      . cefTRIAXone (ROCEPHIN) 2 g in sodium chloride 0.9 % 100 mL IVPB  2 g Intravenous On Call to OR Karie Soda, MD        And  . metroNIDAZOLE (FLAGYL) IVPB 500 mg  500 mg Intravenous On Call to OR Karie Soda, MD      . Chlorhexidine Gluconate Cloth 2 % PADS 6 each  6 each Topical Once Karie Soda, MD      . lactated ringers infusion   Intravenous Continuous Gaynelle Adu, MD 50 mL/hr at 08/07/19 1453    . [MAR Hold] morphine 4 MG/ML injection 4 mg  4 mg Intravenous Q2H PRN Palumbo, April, MD   4 mg at 08/07/19 1306     Allergies  Allergen Reactions  . Flonase [Fluticasone Propionate] Swelling  . Iodine     Topical application  - skin burning    BP 133/79   Pulse 77   Temp 98.4 F (36.9 C) (Oral)   Resp 16   Ht  (1.626 m)   Wt (!) 140.6 kg   LMP 07/09/2019 (Within Days)   SpO2 97%   BMI 53.21 kg/m   Labs: Results for orders placed or performed during the hospital encounter of 08/07/19 (from the past 48 hour(s))  CBC with Differential/Platelet     Status: Abnormal   Collection Time: 08/07/19  5:50 AM  Result Value Ref Range   WBC 12.4 (H) 4.0 - 10.5 K/uL   RBC 5.26 (H) 3.87 - 5.11 MIL/uL   Hemoglobin 15.2 (H) 12.0 - 15.0 g/dL   HCT 16.1 (H) 09.6 - 04.5 %   MCV 89.5 80.0 - 100.0 fL   MCH 28.9 26.0 - 34.0 pg   MCHC 32.3 30.0 - 36.0 g/dL   RDW 40.9 81.1 - 91.4 %   Platelets 270 150 - 400 K/uL   nRBC 0.0 0.0 - 0.2 %   Neutrophils Relative % 79 %   Neutro Abs 9.8 (H) 1.7 - 7.7 K/uL   Lymphocytes Relative 16 %   Lymphs Abs 2.0 0.7 - 4.0 K/uL   Monocytes Relative 4 %   Monocytes Absolute 0.5 0.1 - 1.0 K/uL   Eosinophils Relative 0 %   Eosinophils Absolute 0.0 0.0 - 0.5 K/uL   Basophils Relative 1 %   Basophils Absolute 0.1 0.0 - 0.1 K/uL   Immature Granulocytes 0 %   Abs Immature Granulocytes 0.04 0.00 - 0.07 K/uL    Comment: Performed at [facility], [address] [place], [place] [address]  Troponin I (High Sensitivity)     Status: None   Collection Time: 08/07/19  5:50 AM  Result Value Ref Range   Troponin I (High Sensitivity) <2 <18 ng/L    Comment:  (NOTE) Elevated high sensitivity troponin I (hsTnI) values and significant  changes across serial measurements may suggest ACS but many other  chronic and acute conditions are known to elevate hsTnI results.  Refer to the Links section for chest pain algorithms and additional  guidance. Performed at [facility], [address] [place], [place] [address]   Comprehensive metabolic panel     Status: Abnormal   Collection Time: 08/07/19  5:50 AM  Result Value Ref Range   Sodium 138 135 - 145 mmol/L   Potassium 4.0 3.5 - 5.1 mmol/L    Comment: SLIGHT HEMOLYSIS   Chloride 102 98 - 111 mmol/L   CO2 23 22 - 32 mmol/L   Glucose, Bld 146 (H) 70 - 99 mg/dL   BUN 13 6 - 20 mg/dL   Creatinine, Ser 0.76 0.44 - 1.00 mg/dL   Calcium 9.5 8.9 - 10.3 mg/dL   Total Protein 7.6 6.5 - 8.1 g/dL   Albumin 4.3 3.5 - 5.0 g/dL   AST 24 15 - 41 U/L   ALT 23 0 - 44 U/L   Alkaline Phosphatase 70 38 - 126 U/L   Total Bilirubin 0.7 0.3 - 1.2 mg/dL   GFR calc non Af Amer >60 >60 mL/min   GFR calc Af Amer >60 >60 mL/min   Anion gap 13 5 - 15    Comment: Performed at [facility], [address] [place], [place] [address]  Lipase, blood     Status: None   Collection Time: 08/07/19  5:50 AM  Result Value Ref Range   Lipase 26 11 - 51 U/L    Comment: Performed at [facility], [address] [place], [place] [address]  SARS CORONAVIRUS 2 (TAT 6-24 HRS) Nasopharyngeal Nasopharyngeal Swab     Status: None   Collection Time: 08/07/19  5:50 AM   Specimen: Nasopharyngeal Swab  Result Value Ref Range   SARS Coronavirus 2 NEGATIVE NEGATIVE    Comment: (NOTE) SARS-CoV-2 target nucleic acids are NOT DETECTED. The SARS-CoV-2 RNA is generally detectable in upper and lower respiratory specimens during the acute phase of infection. Negative results do not preclude SARS-CoV-2 infection, do not rule out co-infections with other pathogens, and should not be used as the sole basis for  treatment or other patient management decisions. Negative results must be combined with clinical observations, patient history, and epidemiological information. The expected result is Negative. Fact Sheet for Patients: SugarRoll.be Fact Sheet for Healthcare Providers: https://www.woods-mathews.com/ This test is not yet approved or cleared by the [place] FDA and  has been authorized for detection and/or diagnosis of SARS-CoV-2 by FDA under an Emergency Use Authorization (EUA). This EUA will remain  in effect (meaning this test can be used) for the duration of the COVID-19 declaration under Section 56 4(b)(1) of the Act, 21 U.S.C. section 360bbb-3(b)(1), unless the authorization is terminated or revoked sooner. Performed at [facility], [place] [address], [place], [place] [address]   Pregnancy, urine     Status: None   Collection Time: 08/07/19  6:04 AM  Result Value Ref Range   Preg Test, Ur NEGATIVE NEGATIVE    Comment:        THE SENSITIVITY OF THIS METHODOLOGY IS >20 mIU/mL. Performed at [facility], [address] [place], [place] [address]   SARS Coronavirus 2 by RT PCR (hospital order, performed in [facility] hospital lab) Nasopharyngeal Nasopharyngeal Swab     Status: None   Collection Time: 08/07/19  1:10 PM   Specimen: Nasopharyngeal Swab  Result Value Ref Range   SARS Coronavirus 2 NEGATIVE NEGATIVE    Comment: (NOTE) If result is NEGATIVE SARS-CoV-2 target nucleic acids are NOT DETECTED. The SARS-CoV-2 RNA is generally detectable in upper and lower  respiratory specimens during the acute phase of infection. The lowest  concentration of SARS-CoV-2 viral copies this assay can detect is 250  copies / mL. A negative result does not preclude SARS-CoV-2 infection  and should not be used as the sole basis for treatment or other  patient management decisions.  A negative result may occur with  improper  specimen collection / handling, submission of specimen other  than nasopharyngeal swab, presence of viral mutation(s) within the  areas targeted by this assay, and inadequate number of viral copies  (<250 copies / mL). A negative result must be combined with clinical  observations, patient history, and epidemiological information. If result is POSITIVE SARS-CoV-2 target nucleic acids are DETECTED. The SARS-CoV-2 RNA is generally detectable in upper and lower  respiratory specimens dur ing the acute phase of infection.  Positive  results are indicative of active infection with SARS-CoV-2.  Clinical  correlation with patient history and other diagnostic information is  necessary to determine patient infection status.  Positive results do  not rule out bacterial infection or co-infection with other viruses. If result is PRESUMPTIVE POSTIVE SARS-CoV-2 nucleic acids MAY BE PRESENT.   A presumptive positive result was obtained on the submitted specimen  and confirmed on repeat testing.  While 2019 novel coronavirus  (SARS-CoV-2) nucleic acids may be present in the submitted sample  additional confirmatory testing may be necessary for epidemiological  and / or clinical management purposes  to differentiate between  SARS-CoV-2 and other Sarbecovirus currently known to infect humans.  If clinically indicated additional testing with an alternate test  methodology [phone]) is advised. The SARS-CoV-2 RNA is generally  detectable in upper and lower respiratory sp ecimens during the acute  phase of infection. The expected result is Negative. Fact Sheet for Patients:  BoilerBrush.com.cy Fact Sheet for Healthcare Providers: https://pope.com/ This test is not yet approved or cleared by the [place] FDA and has been authorized for detection and/or diagnosis of SARS-CoV-2 by FDA under an Emergency Use Authorization (EUA).  This EUA will remain in  effect (meaning this test can be used) for the duration of the COVID-19 declaration under Section 564(b)(1) of the Act, 21 U.S.C. section 360bbb-3(b)(1), unless the authorization is terminated or revoked sooner. Performed at [facility], [address], [place], [place] [address]     Imaging / Studies: Ct Abdomen Pelvis W Contrast  Result Date: 08/07/2019 CLINICAL DATA:  Right upper quadrant pain radiating to the shoulder EXAM: CT ABDOMEN AND PELVIS WITH CONTRAST TECHNIQUE: Multidetector CT imaging of the abdomen and pelvis was performed using the standard protocol following bolus administration of intravenous contrast. CONTRAST:  OMNIPAQUE IOHEXOL 300 MG/ML  SOLN COMPARISON:  04/05/2008 FINDINGS: Lower chest:  No contributory findings. Hepatobiliary: No focal liver abnormality.Three calcified gallstones. Mild gallbladder wall thickening. No pericholecystic inflammation or gallbladder over distension. No bile duct dilatation. Pancreas: Unremarkable. Spleen: Unremarkable. Adrenals/Urinary Tract: Negative adrenals. No hydronephrosis or stone. Unremarkable bladder. Stomach/Bowel: No obstruction. No appendicitis. Fecalized distal ileal contents, considered incidental. Vascular/Lymphatic: No acute vascular abnormality. No mass or adenopathy. Reproductive:No pathologic findings. Other: No ascites or pneumoperitoneum. Musculoskeletal: No acute abnormalities. Degenerative endplate spurring at multiple levels IMPRESSION: Cholelithiasis and mild gallbladder wall thickening. If  symptoms could be related to cholecystitis, recommend sonographic follow-up. Electronically Signed   By: Marnee SpringJonathon  Watts M.D.   On: 08/07/2019 07:27   Dg Chest Portable 1 View  Result Date: 08/07/2019 CLINICAL DATA:  Right upper abdominal pain EXAM: PORTABLE CHEST 1 VIEW COMPARISON:  01/11/2010 FINDINGS: Normal heart size and mediastinal contours. There is no edema, consolidation, effusion, or pneumothorax.  Artifact from EKG leads. IMPRESSION: No evidence of active disease. Electronically Signed   By: Marnee SpringJonathon  Watts M.D.   On: 08/07/2019 07:10   [place] Abdomen Limited Ruq  Result Date: 08/07/2019 CLINICAL DATA:  Right abdomen pain starting this morning EXAM: ULTRASOUND ABDOMEN LIMITED RIGHT UPPER QUADRANT COMPARISON:  April 05, 2008 FINDINGS: Gallbladder: Gallstones are identified in the gallbladder. The gallbladder wall is mildly thickened measuring 3.4 mm. The ultrasonographer reports positive sonographic Murphy sign. There is a 3.1 mm polyp in the gallbladder. Common bile duct: Diameter: 5.9 mm Liver: No focal liver lesion. Diffuse increased echotexture of the liver is noted. Portal vein is patent on color Doppler imaging with normal direction of blood flow towards the liver. Other: None. IMPRESSION: Findings suspicious for acute cholecystitis. Diffuse increased echotexture of the liver, nonspecific but can be seen in fatty infiltration of liver. Electronically Signed   By: Sherian ReinWei-Chen  Lin M.D.   On: 08/07/2019 09:21     .Ardeth SportsmanSteven C. Nyeisha Goodall, M.D., F.A.C.S. Gastrointestinal and Minimally Invasive Surgery [facility] [address] [place], [place] [id number] [phone] Main / Paging  08/07/2019 3:07 PM    Ardeth SportsmanSteven C Sephira Zellman

## 2019-08-07 NOTE — ED Triage Notes (Signed)
Right sided upper abd pain radiating to R shoulder starting at 0015 AM today. NPO status 10 minutes ago.

## 2019-08-07 NOTE — Anesthesia Preprocedure Evaluation (Addendum)
Anesthesia Evaluation  Patient identified by MRN, date of birth, ID band Patient awake    Reviewed: Allergy & Precautions, H&P , NPO status , Patient's Chart, lab work & pertinent test results  Airway Mallampati: III  TM Distance: >3 FB Neck ROM: Full    Dental no notable dental hx. (+) Partial Upper, Dental Advisory Given   Pulmonary Current SmokerPatient did not abstain from smoking.,    Pulmonary exam normal breath sounds clear to auscultation       Cardiovascular negative cardio ROS   Rhythm:Regular Rate:Normal     Neuro/Psych negative neurological ROS  negative psych ROS   GI/Hepatic negative GI ROS, Neg liver ROS,   Endo/Other  Morbid obesity  Renal/GU negative Renal ROS  negative genitourinary   Musculoskeletal   Abdominal   Peds  Hematology negative hematology ROS (+)   Anesthesia Other Findings   Reproductive/Obstetrics negative OB ROS                           Anesthesia Physical Anesthesia Plan  ASA: III  Anesthesia Plan: General   Post-op Pain Management:    Induction: Intravenous  PONV Risk Score and Plan: 3 and Ondansetron, Dexamethasone and Midazolam  Airway Management Planned: Oral ETT and Video Laryngoscope Planned  Additional Equipment:   Intra-op Plan:   Post-operative Plan: Extubation in OR  Informed Consent: I have reviewed the patients History and Physical, chart, labs and discussed the procedure including the risks, benefits and alternatives for the proposed anesthesia with the patient or authorized representative who has indicated his/her understanding and acceptance.     Dental advisory given  Plan Discussed with: CRNA  Anesthesia Plan Comments:         Anesthesia Quick Evaluation

## 2019-08-07 NOTE — Discharge Instructions (Addendum)
LAPAROSCOPIC SURGERY: POST OP INSTRUCTIONS  1. DIET: Follow a light bland diet the first 24 hours after arrival home, such as soup, liquids, crackers, etc.  Be sure to include lots of fluids daily.  Avoid fast food or heavy meals as your are more likely to get nauseated.  Eat a low fat the next few days after surgery.   2. Take your usually prescribed home medications unless otherwise directed. 3. PAIN CONTROL: a. Pain is best controlled by a usual combination of three different methods TOGETHER: i. Ice/Heat ii. Over the counter pain medication iii. Prescription pain medication b. Most patients will experience some swelling and bruising around the incisions.  Ice packs or heating pads (30-60 minutes up to 6 times a day) will help. Use ice for the first few days to help decrease swelling and bruising, then switch to heat to help relax tight/sore spots and speed recovery.  Some people prefer to use ice alone, heat alone, alternating between ice & heat.  Experiment to what works for you.  Swelling and bruising can take several weeks to resolve.   c. It is helpful to take an over-the-counter pain medication regularly for the first few weeks.  Choose one of the following that works best for you: i. Naproxen (Aleve, etc)  Two 220mg  tabs twice a day ii. Ibuprofen (Advil, etc) Three 200mg  tabs four times a day (every meal & bedtime) d. A  prescription for pain medication (such as percocet, vicodin, oxycodone, hydrocodone, etc) should be given to you upon discharge.  Take your pain medication as prescribed.  i. If you are having problems/concerns with the prescription medicine (does not control pain, nausea, vomiting, rash, itching, etc), please call us [phone] to see if we need to switch you to a different pain medicine that will work better for you and/or control your side effect better. ii. If you need a refill on your pain medication, please contact your pharmacy.  They will contact our office to  request authorization. Prescriptions will not be filled after 5 pm or on week-ends.   4. Avoid getting constipated.  Between the surgery and the pain medications, it is common to experience some constipation.  Increasing fluid intake and taking a fiber supplement (such as Metamucil, Citrucel, FiberCon, MiraLax, etc) 1-2 times a day regularly will usually help prevent this problem from occurring.  A mild laxative (prune juice, Milk of Magnesia, MiraLax, etc) should be taken according to package directions if there are no bowel movements after 48 hours.   5. Watch out for diarrhea.  If you have many loose bowel movements, simplify your diet to bland foods & liquids for a few days.  Stop any stool softeners and decrease your fiber supplement.  Switching to mild anti-diarrheal medications (Kayopectate, Pepto Bismol) can help.  If this worsens or does not improve, please call us. 6. Wash / shower every day.  You may shower over the dressings as they are waterproof.  Continue to shower over incision(s) after the dressing is off. 7. Remove your waterproof bandages 2 days after surgery.  You may leave the incision open to air.  You may replace a dressing/Band-Aid to cover the incision for comfort if you wish.  8. ACTIVITIES as tolerated:   a. You may resume regular (light) daily activities beginning the next day--such as daily self-care, walking, climbing stairs--gradually increasing activities as tolerated.  If you can walk 30 minutes without difficulty, it is safe to try more intense activity such as  jogging, treadmill, bicycling, low-impact aerobics, swimming, etc. °b. Save the most intensive and strenuous activity for last such as sit-ups, heavy lifting, contact sports, etc  Refrain from any heavy lifting or straining until you are off narcotics for pain control.   °c. DO NOT PUSH THROUGH PAIN.  Let pain be your guide: If it hurts to do something, don't do it.  Pain is your body warning you to avoid that  activity for another week until the pain goes down. °d. You may drive when you are no longer taking prescription pain medication, you can comfortably wear a seatbelt, and you can safely maneuver your car and apply brakes. °e. You may have sexual intercourse when it is comfortable.  °9. FOLLOW UP in our office °a. Please call [facility] at [phone] to set up an appointment to see your surgeon in the office for a follow-up appointment approximately 2-3 weeks after your surgery. °b. Make sure that you call for this appointment the day you arrive home to insure a convenient appointment time. °10. IF YOU HAVE DISABILITY OR FAMILY LEAVE FORMS, BRING THEM TO THE OFFICE FOR PROCESSING.  DO NOT GIVE THEM TO YOUR DOCTOR. ° ° °WHEN TO CALL US [phone]: °1. Poor pain control °2. Reactions / problems with new medications (rash/itching, nausea, etc)  °3. Fever over 101.5 F (38.5 C) °4. Inability to urinate °5. Nausea and/or vomiting °6. Worsening swelling or bruising °7. Continued bleeding from incision. °8. Increased pain, redness, or drainage from the incision ° ° The clinic staff is available to answer your questions during regular business hours (8:30am-5pm).  Please don’t hesitate to call and ask to speak to one of our nurses for clinical concerns.  ° If you have a medical emergency, go to the nearest emergency room or call 911. ° A surgeon from [facility] is always on call at the hospitals ° ° °[facility] °[address], [address], [place], [place]  [address] ? °MAIN: [phone] ? TOLL FREE: [phone] ?  °FAX [phone] °[email or web address] ° ° °

## 2019-08-07 NOTE — ED Notes (Signed)
Ultrasound tech at bedside to perform abd u/s. Pt given mouth swab per request, states her pain remains 5/10, "no better, no worse".

## 2019-08-08 ENCOUNTER — Encounter (HOSPITAL_COMMUNITY): Payer: Self-pay | Admitting: Surgery

## 2019-08-08 LAB — HIV ANTIBODY (ROUTINE TESTING W REFLEX): HIV Screen 4th Generation wRfx: NONREACTIVE

## 2019-08-08 MED ORDER — OXYCODONE HCL 5 MG PO TABS: 5.0000 mg | ORAL_TABLET | Freq: Four times a day (QID) | ORAL | 0 refills | Status: DC | PRN

## 2019-08-08 MED ORDER — DOCUSATE SODIUM 100 MG PO CAPS: 100.0000 mg | ORAL_CAPSULE | Freq: Two times a day (BID) | ORAL | 0 refills | Status: AC

## 2019-08-08 NOTE — Plan of Care (Signed)
Patient in bed this morning; pain controlled. No needs expressed. Will continue to monitor.

## 2019-08-08 NOTE — Discharge Summary (Signed)
Physician Discharge Summary  Patient ID: Melissa Coleman MRN: [id number] DOB/AGE: 06/13/1976 43 y.o.  Admit date: 08/07/2019 Discharge date: 08/08/2019  Admission Diagnoses: cholecystitis   Discharge Diagnoses:  Principal Problem:   Acute on chronic cholecystitis s/p lap cholecystectomy 08/07/2019 Active Problems:   Allergy to pollen   BMI 50.0-59.9, adult (HCC)   Generalized anxiety disorder   Impaired fasting glucose   Insomnia   Mixed hyperlipidemia   Seasonal allergic rhinitis due to pollen   Discharged Condition: good  Hospital Course: Patient admitted after surgery.  She did well overnight and was discharged to home once tolerating a diet and PO pain meds.  Consults: None  Significant Diagnostic Studies: labs: cbc, cmet  Treatments: IV hydration, analgesia: acetaminophen and surgery: lap cholecystectomy  Discharge Exam: Blood pressure (!) 147/81, pulse 72, temperature 98.6 F (37 C), temperature source Oral, resp. rate 16, height 5\' 4"  (1.626 m), weight (!) 140.6 kg, last menstrual period 07/09/2019, SpO2 95 %. General appearance: alert and cooperative Cardio: regular rate and rhythm GI: normal findings: soft, non-tender Incision/Wound: clean, dry intact  Disposition: Discharge disposition: 01-Home or Self Care        Allergies as of 08/08/2019      Reactions   Flonase [fluticasone Propionate] Swelling   Iodine    Topical application  - skin burning      Medication List    TAKE these medications   docusate sodium 100 MG capsule Commonly known as: Colace Take 1 capsule (100 mg total) by mouth 2 (two) times daily.   naproxen 500 MG tablet Commonly known as: NAPROSYN Take 1 tablet (500 mg total) by mouth 2 (two) times daily.   oxyCODONE 5 MG immediate release tablet Commonly known as: Oxy IR/ROXICODONE Take 1-2 tablets (5-10 mg total) by mouth every 6 (six) hours as needed for moderate pain or severe pain (5mg  for moderate pain, 10mg  for severe  pain).        Signed: Rosario Adie 02/72/5366, 8:54 AM

## 2019-08-10 LAB — SURGICAL PATHOLOGY

## 2020-12-16 IMAGING — DX DG CHEST 1V PORT
1 series · 1 of 1 positions shown · non-contrast
Comparison: 01/11/2010

CLINICAL DATA: Right upper abdominal pain

EXAM:
PORTABLE CHEST 1 VIEW

[chest ap]
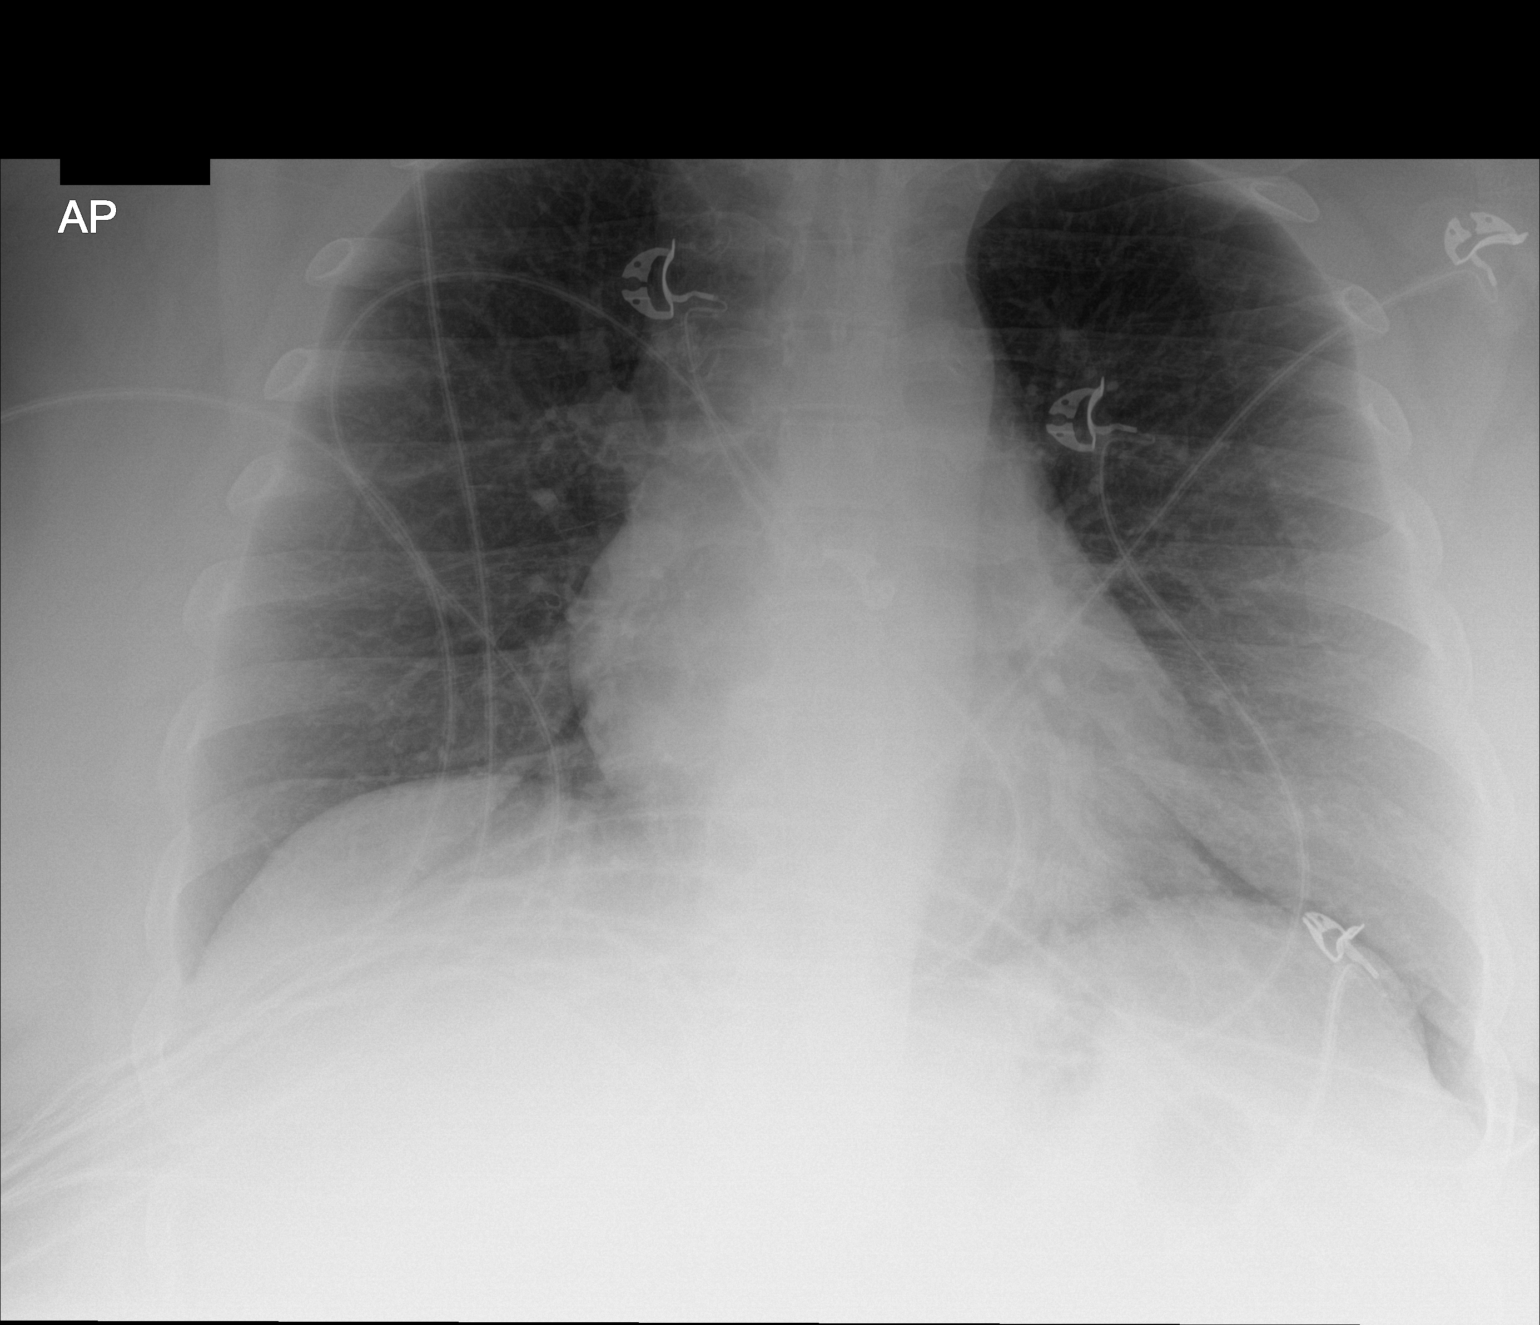

[1 of 1 positions shown; findings below may reference images not displayed]

FINDINGS: Normal heart size and mediastinal contours. There is no edema,
consolidation, effusion, or pneumothorax. Artifact from EKG leads.
IMPRESSION: No evidence of active disease.

## 2020-12-16 IMAGING — CT CT ABD-PELV W/ CM
2 of 5 series · 17 of 46 positions shown, 19 images · IV contrast (APPLIED)
Comparison: 04/05/2008

CLINICAL DATA: Right upper quadrant pain radiating to the shoulder

EXAM:
CT ABDOMEN AND PELVIS WITH CONTRAST
TECHNIQUE: Multidetector CT imaging of the abdomen and pelvis was performed
using the standard protocol following bolus administration of
intravenous contrast.
CONTRAST:  100mL OMNIPAQUE IOHEXOL 300 MG/ML  SOLN

[Series 2: axial st · axial · 0.84mm/px · z∈[+973,+1383]mm · 14 of 92 slices shown, 16 images]
[im 5/92  soft-tissue]
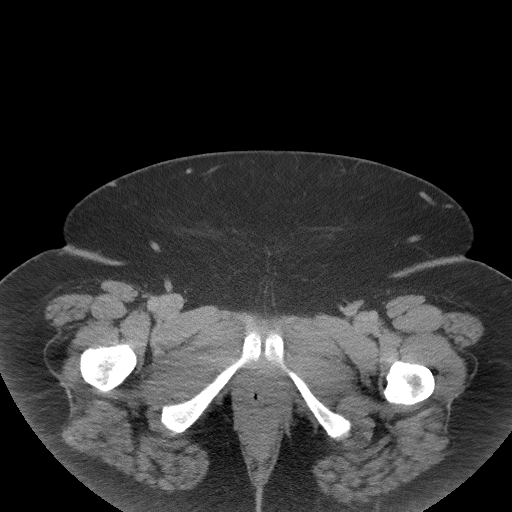
[im 5/92  bone]
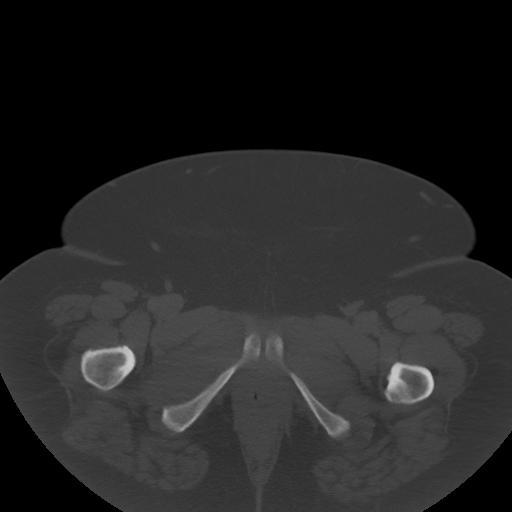
[im 10/92  soft-tissue]
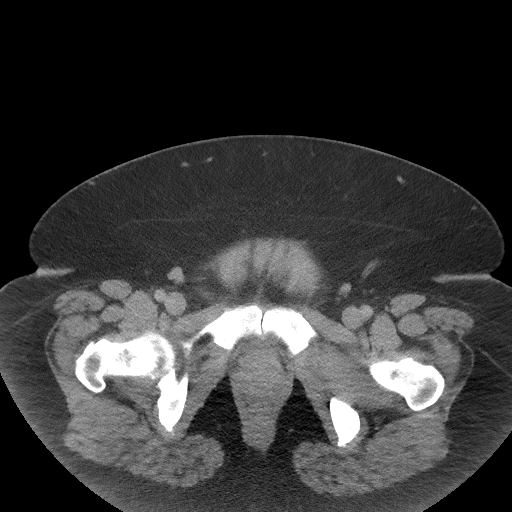
[im 20/92  soft-tissue]
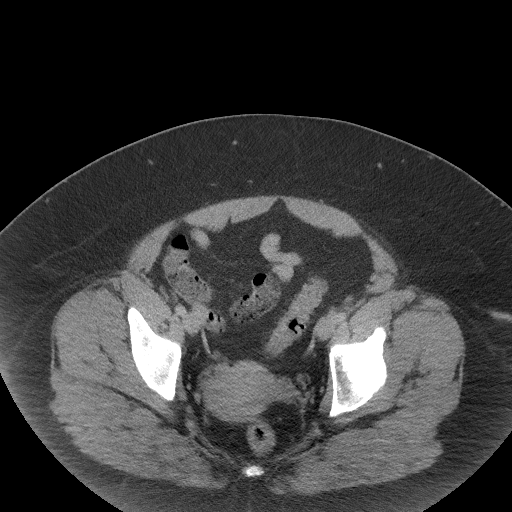
[im 24/92  soft-tissue]
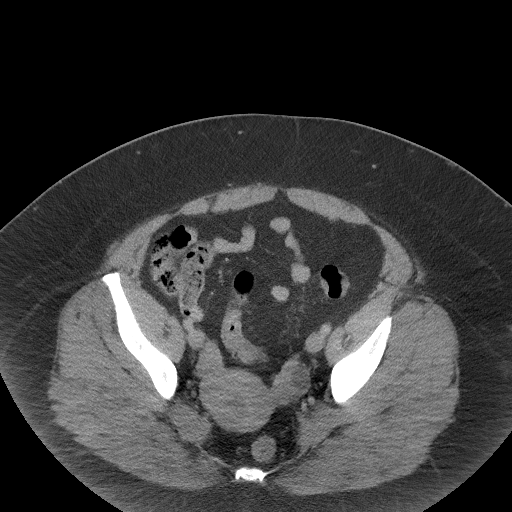
[im 29/92  soft-tissue]
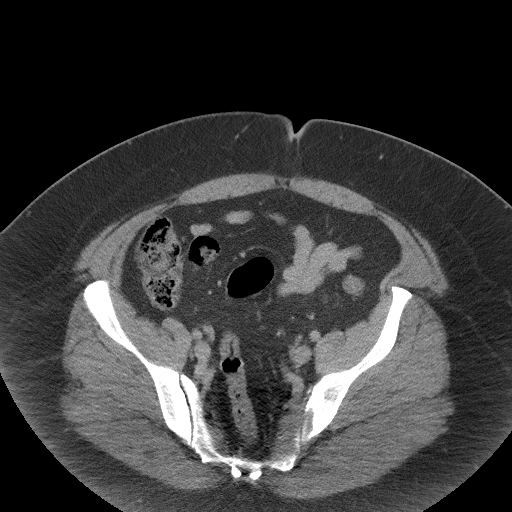
[im 39/92  soft-tissue]
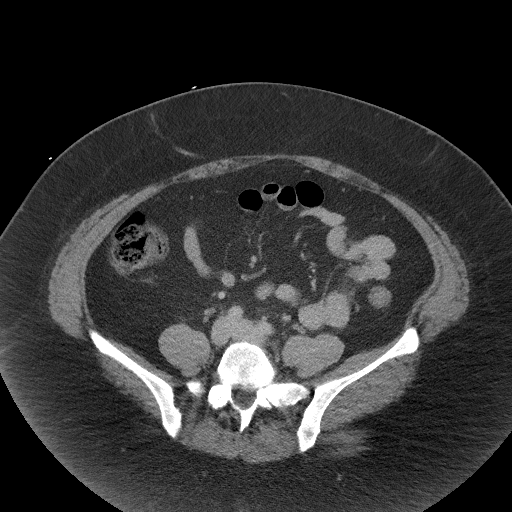
[im 44/92  soft-tissue]
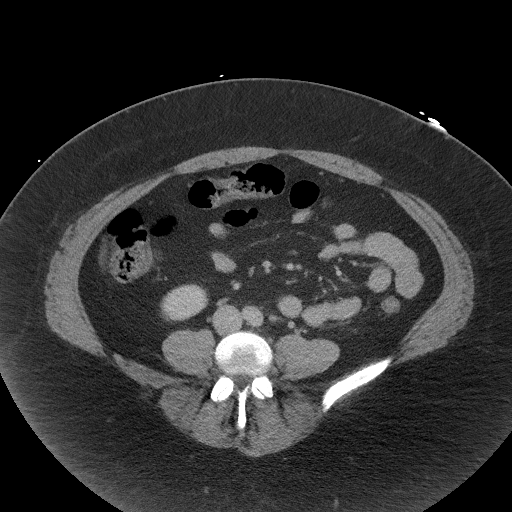
[im 48/92  soft-tissue]
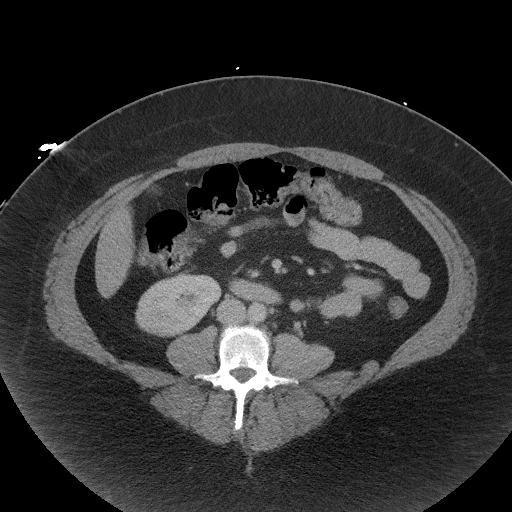
[im 53/92  soft-tissue]
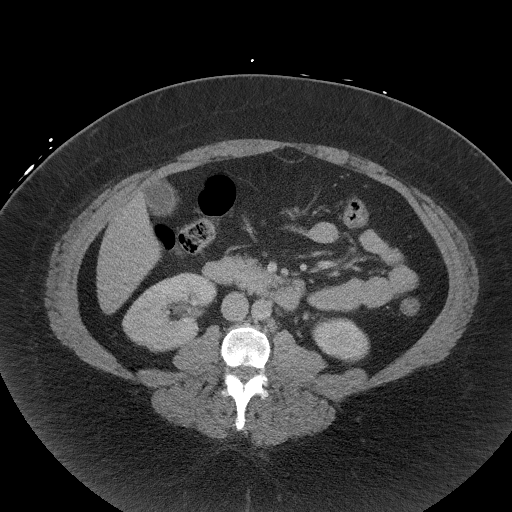
[im 53/92  bone]
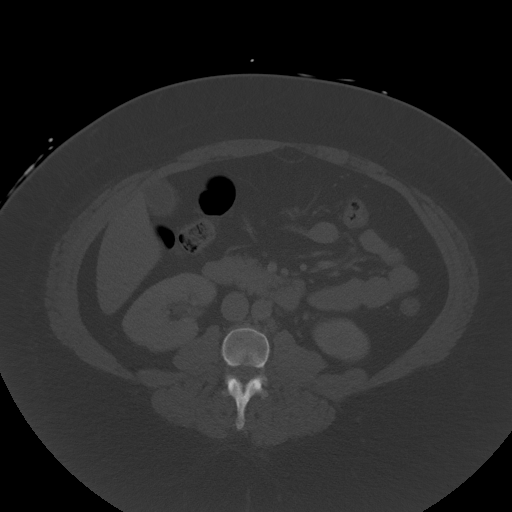
[im 63/92  soft-tissue]
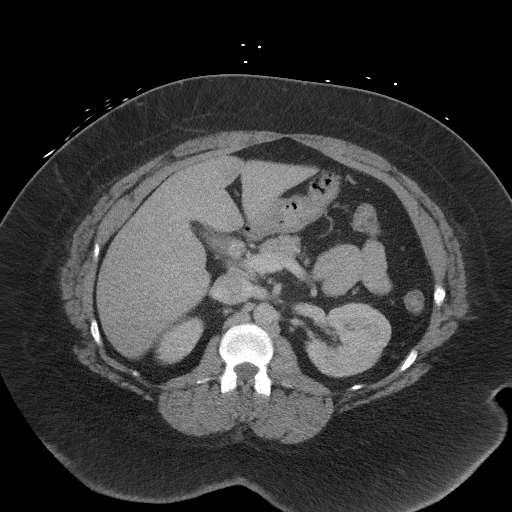
[im 68/92  soft-tissue]
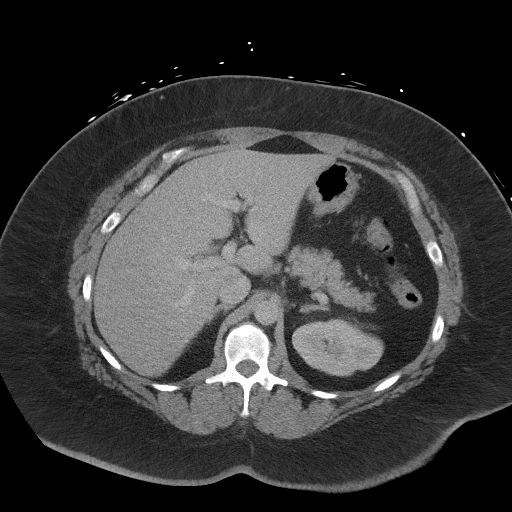
[im 72/92  soft-tissue]
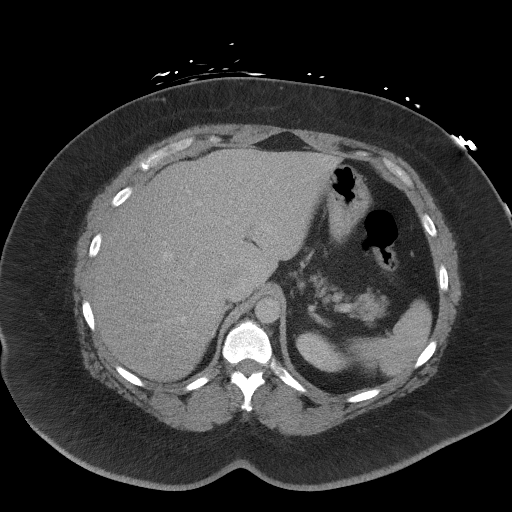
[im 82/92  soft-tissue]
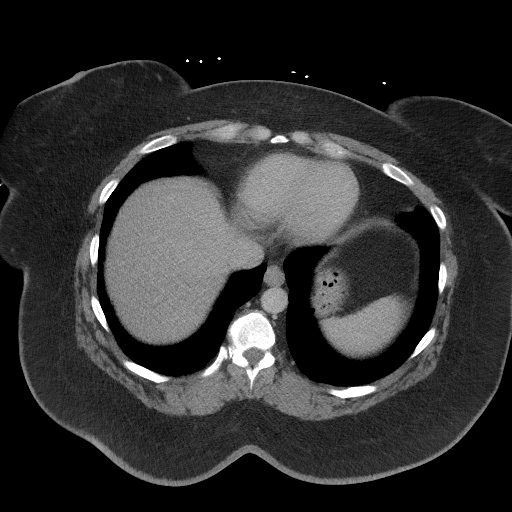
[im 87/92  soft-tissue]
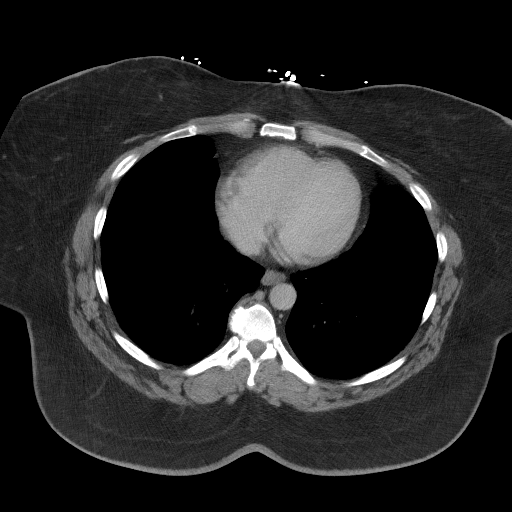

[Series 8: coronal st · coronal · 0.85mm/px · 3 of 113 slices shown]
[im 38/113  soft-tissue]
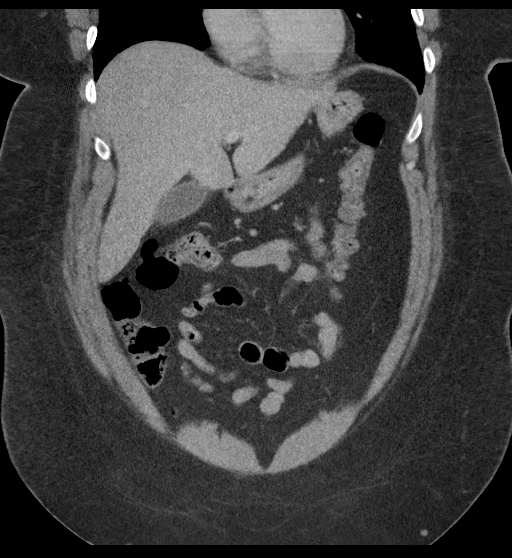
[im 50/113  soft-tissue]
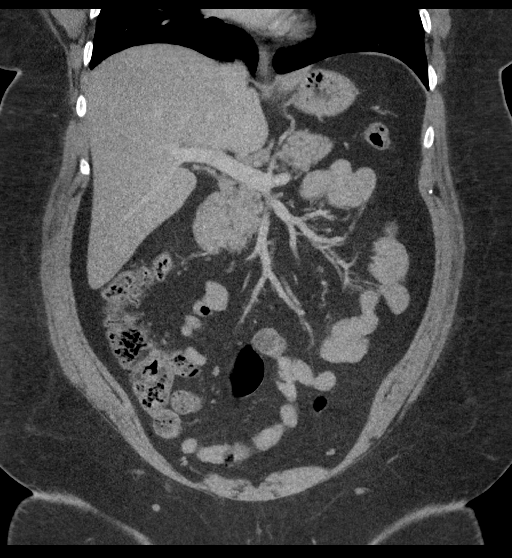
[im 63/113  soft-tissue]
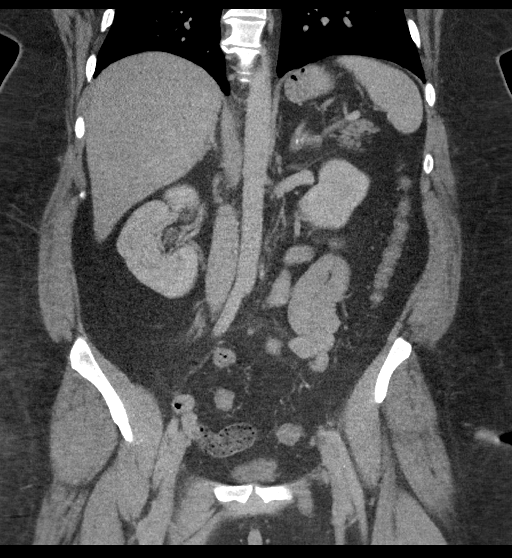

[17 of 46 positions shown; findings below may reference images not displayed]

FINDINGS: Lower chest:  No contributory findings.

Hepatobiliary: No focal liver abnormality.Three calcified
gallstones. Mild gallbladder wall thickening. No pericholecystic
inflammation or gallbladder over distension. No bile duct
dilatation.

Pancreas: Unremarkable.

Spleen: Unremarkable.

Adrenals/Urinary Tract: Negative adrenals. No hydronephrosis or
stone. Unremarkable bladder.

Stomach/Bowel: No obstruction. No appendicitis. Fecalized distal
ileal contents, considered incidental.

Vascular/Lymphatic: No acute vascular abnormality. No mass or
adenopathy.

Reproductive:No pathologic findings.

Other: No ascites or pneumoperitoneum.

Musculoskeletal: No acute abnormalities. Degenerative endplate
spurring at multiple levels
IMPRESSION: Cholelithiasis and mild gallbladder wall thickening. If symptoms
could be related to cholecystitis, recommend sonographic follow-up.

## 2023-04-06 ENCOUNTER — Emergency Department (HOSPITAL_BASED_OUTPATIENT_CLINIC_OR_DEPARTMENT_OTHER): Payer: Commercial Managed Care - HMO

## 2023-04-06 ENCOUNTER — Other Ambulatory Visit: Payer: Self-pay

## 2023-04-06 ENCOUNTER — Emergency Department (HOSPITAL_BASED_OUTPATIENT_CLINIC_OR_DEPARTMENT_OTHER)
Admission: EM | Admit: 2023-04-06 | Discharge: 2023-04-06 | Disposition: A | Discharge status: Home / Self Care | Payer: Commercial Managed Care - HMO | Attending: Emergency Medicine | Admitting: Emergency Medicine

## 2023-04-06 ENCOUNTER — Encounter (HOSPITAL_BASED_OUTPATIENT_CLINIC_OR_DEPARTMENT_OTHER): Payer: Self-pay | Admitting: Emergency Medicine

## 2023-04-06 DIAGNOSIS — K047 Periapical abscess without sinus: Secondary | ICD-10-CM | POA: Diagnosis not present

## 2023-04-06 DIAGNOSIS — R519 Headache, unspecified: Secondary | ICD-10-CM

## 2023-04-06 LAB — CBC WITH DIFFERENTIAL/PLATELET
Abs Immature Granulocytes: 0.02 10*3/uL (ref 0.00–0.07)
Basophils Absolute: 0.1 10*3/uL (ref 0.0–0.1)
Basophils Relative: 1 %
Eosinophils Absolute: 0.2 10*3/uL (ref 0.0–0.5)
Eosinophils Relative: 2 %
HCT: 44.7 % (ref 36.0–46.0)
Hemoglobin: 15.1 g/dL — ABNORMAL HIGH (ref 12.0–15.0)
Immature Granulocytes: 0 %
Lymphocytes Relative: 30 %
Lymphs Abs: 3.3 10*3/uL (ref 0.7–4.0)
MCH: 29.4 pg (ref 26.0–34.0)
MCHC: 33.8 g/dL (ref 30.0–36.0)
MCV: 87 fL (ref 80.0–100.0)
Monocytes Absolute: 0.8 10*3/uL (ref 0.1–1.0)
Monocytes Relative: 7 %
Neutro Abs: 6.7 10*3/uL (ref 1.7–7.7)
Neutrophils Relative %: 60 %
Platelets: 247 10*3/uL (ref 150–400)
RBC: 5.14 MIL/uL — ABNORMAL HIGH (ref 3.87–5.11)
RDW: 12.5 % (ref 11.5–15.5)
WBC: 11.1 10*3/uL — ABNORMAL HIGH (ref 4.0–10.5)
nRBC: 0 % (ref 0.0–0.2)

## 2023-04-06 LAB — BASIC METABOLIC PANEL
Anion gap: 10 (ref 5–15)
BUN: 12 mg/dL (ref 6–20)
CO2: 22 mmol/L (ref 22–32)
Calcium: 8.4 mg/dL — ABNORMAL LOW (ref 8.9–10.3)
Chloride: 104 mmol/L (ref 98–111)
Creatinine, Ser: 0.81 mg/dL (ref 0.44–1.00)
GFR, Estimated: >60 mL/min (ref >60)
Glucose, Bld: 103 mg/dL — ABNORMAL HIGH (ref 70–99)
Potassium: 4.1 mmol/L (ref 3.5–5.1)
Sodium: 136 mmol/L (ref 135–145)

## 2023-04-06 MED ORDER — MORPHINE SULFATE (PF) 4 MG/ML IV SOLN
4.0000 mg | Freq: Once | INTRAVENOUS | Status: AC
  Administered 2023-04-06: 4 mg via INTRAVENOUS
  Filled 2023-04-06: qty 1

## 2023-04-06 MED ORDER — HYDROMORPHONE HCL 1 MG/ML IJ SOLN
1.0000 mg | Freq: Once | INTRAMUSCULAR | Status: AC
  Administered 2023-04-06: 1 mg via INTRAVENOUS
  Filled 2023-04-06: qty 1

## 2023-04-06 MED ORDER — ONDANSETRON HCL 4 MG/2ML IJ SOLN
4.0000 mg | Freq: Once | INTRAMUSCULAR | Status: AC
  Administered 2023-04-06: 4 mg via INTRAVENOUS
  Filled 2023-04-06: qty 2

## 2023-04-06 MED ORDER — OXYCODONE HCL 5 MG PO TABS: 5.0000 mg | ORAL_TABLET | Freq: Four times a day (QID) | ORAL | 0 refills | Status: AC | PRN

## 2023-04-06 MED ORDER — SODIUM CHLORIDE 0.9 % IV SOLN
1.0000 g | Freq: Once | INTRAVENOUS | Status: AC
  Administered 2023-04-06: 1 g via INTRAVENOUS
  Filled 2023-04-06: qty 10

## 2023-04-06 MED ORDER — HYDROMORPHONE HCL 1 MG/ML IJ SOLN
0.5000 mg | Freq: Once | INTRAMUSCULAR | Status: AC
  Administered 2023-04-06: 0.5 mg via INTRAVENOUS
  Filled 2023-04-06: qty 1

## 2023-04-06 MED ORDER — OXYCODONE HCL 5 MG PO TABS: 5.0000 mg | ORAL_TABLET | Freq: Four times a day (QID) | ORAL | 0 refills | Status: DC | PRN

## 2023-04-06 MED ORDER — IOHEXOL 300 MG/ML  SOLN
75.0000 mL | Freq: Once | INTRAMUSCULAR | Status: AC | PRN
  Administered 2023-04-06: 75 mL via INTRAVENOUS

## 2023-04-06 MED ORDER — KETOROLAC TROMETHAMINE 30 MG/ML IJ SOLN
30.0000 mg | Freq: Once | INTRAMUSCULAR | Status: AC
  Administered 2023-04-06: 30 mg via INTRAVENOUS
  Filled 2023-04-06: qty 1

## 2023-04-06 NOTE — Discharge Instructions (Signed)
Please read and follow all provided instructions.  Your diagnoses today include:  1. Dental abscess   2. Right sided facial pain     The exam and treatment you received today has been provided on an emergency basis only. This is not a substitute for complete medical or dental care.  Tests performed today include: Vital signs. See below for your results today.  Blood cell counts and electrolytes  CT scan of your face which showed dental abscess and associated facial swelling related to that infection  Medications prescribed:  Oxycodone - narcotic pain medication  DO NOT drive or perform any activities that require you to be awake and alert because this medicine can make you drowsy.   Take any prescribed medications only as directed.  Home care instructions:  Follow any educational materials contained in this packet.  Please continue to take the clindamycin as prescribed.  Follow-up instructions: Please follow-up with your dentist for further evaluation of your symptoms.   Return instructions:  Please return to the Emergency Department if you experience worsening symptoms. Please return if you develop a fever, you develop more swelling in your face or neck, you have trouble breathing or swallowing food. Return if the skin of the face or overlying the cheek becomes more red or if you notice drainage coming from the skin Please return if you have any other emergent concerns.  Additional Information:  Your vital signs today were: BP (!) 158/97   Pulse 84   Temp 98.1 F (36.7 C) (Oral)   Resp 20   Ht 5\' 4"  (1.626 m)   Wt (!) 140.6 kg   LMP 02/13/2023   SpO2 100%   BMI 53.21 kg/m  If your blood pressure (BP) was elevated above 135/85 this visit, please have this repeated by your doctor within one month. --------------

## 2023-04-06 NOTE — ED Triage Notes (Signed)
Pt.  W/ RT side facial pain and swelling since Wed; has been seen via telehealth and UC visit and has received medications and is on abx

## 2023-04-06 NOTE — ED Notes (Signed)
Pt sts the po ice has helped the pain more than anything else

## 2023-04-06 NOTE — ED Provider Notes (Signed)
[facility] Provider Note   CSN: [id number] Arrival date & time: 04/06/23  1203     History  Chief Complaint  Patient presents with   Facial Pain    Melissa Coleman is a 47 y.o. female.  Patient presents to the emergency department today for evaluation of right-sided facial pain and swelling.  Symptoms started about 3 days ago.  Patient states that she had a telehealth visit and was diagnosed with a sinus infection.  She was started on amoxicillin.  Her pain then increased and she was seen at urgent care yesterday and started on therapy for suspected dental infection.  She was given a dose of IM Rocephin.  She was started on clindamycin 300 mg of which she has taken 2 total doses to this point.  She was also given prednisone 20 mg daily.  After receiving injection yesterday she states that she actually did feel better and the swelling improved for time, but was worse today with intense pain.  She states that the pain is worse than childbirth.  She describes it as a throbbing pain.  She has had a runny nose and drainage from her right eye as well.  No fevers, no vision changes.  No history of immunocompromise.       Home Medications Prior to Admission medications   Medication Sig Start Date End Date Taking? Authorizing Provider  docusate sodium (COLACE) 100 MG capsule Take 1 capsule (100 mg total) by mouth 2 (two) times daily. 08/08/19   Romie Levee, MD  naproxen (NAPROSYN) 500 MG tablet Take 1 tablet (500 mg total) by mouth 2 (two) times daily. Patient not taking: Reported on 08/07/2019 08/29/12   Vanetta Mulders, MD  oxyCODONE (OXY IR/ROXICODONE) 5 MG immediate release tablet Take 1-2 tablets (5-10 mg total) by mouth every 6 (six) hours as needed for moderate pain or severe pain (5mg  for moderate pain, 10mg  for severe pain). 08/08/19   Romie Levee, MD      Allergies    Flonase [fluticasone propionate] and Iodine    Review of Systems    Review of Systems  Physical Exam Updated Vital Signs BP (!) 162/103 (BP Location: Left Arm) Comment: pt actively crying, c/o head pain  Pulse 90   Temp 98.1 F (36.7 C) (Oral)   Resp 20   Ht 5\' 4"  (1.626 m)   Wt (!) 140.6 kg   LMP 02/13/2023   SpO2 99%   BMI 53.21 kg/m   Physical Exam Vitals and nursing note reviewed.  Constitutional:      General: She is in acute distress.     Appearance: She is well-developed.     Comments: Patient is tearful, crying, very uncomfortable  HENT:     Head: Normocephalic.     Comments: Patient with some mild induration and erythema of the right cheek inferior to the right orbit.  Question small abscess with internal palpation, but unclear.  Patient is exquisitely tender.  She winces and jumps with even light palpation of her face.  No proptosis.  No active drainage.  Sclera appears clear.    Right Ear: Tympanic membrane, ear canal and external ear normal.     Left Ear: Tympanic membrane, ear canal and external ear normal.     Nose: Congestion present.     Mouth/Throat:     Mouth: Mucous membranes are moist.  Eyes:     Conjunctiva/sclera: Conjunctivae normal.  Pulmonary:     Effort:  No respiratory distress.  Musculoskeletal:     Cervical back: Normal range of motion and neck supple.  Skin:    General: Skin is warm and dry.  Neurological:     Mental Status: She is alert.     ED Results / Procedures / Treatments   Labs (all labs ordered are listed, but only abnormal results are displayed) Labs Reviewed  CBC WITH DIFFERENTIAL/PLATELET - Abnormal; Notable for the following components:      Result Value   WBC 11.1 (*)    RBC 5.14 (*)    Hemoglobin 15.1 (*)    All other components within normal limits  BASIC METABOLIC PANEL - Abnormal; Notable for the following components:   Glucose, Bld 103 (*)    Calcium 8.4 (*)    All other components within normal limits    EKG None  Radiology CT Maxillofacial W Contrast  Result Date:  04/06/2023 CLINICAL DATA:  Severe right-sided facial pain and swelling. EXAM: CT MAXILLOFACIAL WITH CONTRAST TECHNIQUE: Multidetector CT imaging of the maxillofacial structures was performed with intravenous contrast. Multiplanar CT image reconstructions were also generated. RADIATION DOSE REDUCTION: This exam was performed according to the departmental dose-optimization program which includes automated exposure control, adjustment of the mA and/or kV according to patient size and/or use of iterative reconstruction technique. CONTRAST:  75mL OMNIPAQUE IOHEXOL 300 MG/ML  SOLN COMPARISON:  None Available. FINDINGS: Osseous: Periapical lucency centered around the right maxillary canine with involvement of the adjacent first bicuspid and lateral incisor. Associated odontogenic abscess and extending along the soft palate (image 47 series 2) and gingiva (image 48 series 2). Dental caries of multiple maxillary teeth with periapical lucency of the left maxillary canine. Orbits: Negative. No traumatic or inflammatory finding. Sinuses: Mild mucosal disease in the right maxillary sinus. Soft tissues: Moderate fat stranding extending along the right cheek and jaw. Limited intracranial: Unremarkable. IMPRESSION: 1. Periapical lucency centered around the right maxillary canine with involvement of the adjacent first bicuspid and lateral incisor. Associated odontogenic abscess and extending along the soft palate and gingiva. 2. Dental caries of multiple maxillary teeth with periapical lucency of the left maxillary canine. Electronically Signed   By: Orvan Falconer M.D.   On: 04/06/2023 15:25    Procedures Procedures    Medications Ordered in ED Medications  morphine (PF) 4 MG/ML injection 4 mg (4 mg Intravenous Given 04/06/23 1243)  ondansetron (ZOFRAN) injection 4 mg (4 mg Intravenous Given 04/06/23 1242)  HYDROmorphone (DILAUDID) injection 0.5 mg (0.5 mg Intravenous Given 04/06/23 1405)  iohexol (OMNIPAQUE) 300 MG/ML  solution 75 mL (75 mLs Intravenous Contrast Given 04/06/23 1443)  HYDROmorphone (DILAUDID) injection 1 mg (1 mg Intravenous Given 04/06/23 1715)  ketorolac (TORADOL) 30 MG/ML injection 30 mg (30 mg Intravenous Given 04/06/23 1714)  cefTRIAXone (ROCEPHIN) 1 g in sodium chloride 0.9 % 100 mL IVPB (1 g Intravenous New Bag/Given 04/06/23 1725)    ED Course/ Medical Decision Making/ A&P    Patient seen and examined. History obtained directly from patient.  I reviewed patient's outpatient urgent care note and treatment plan.  Labs/EKG: Ordered CBC, BMP.  Imaging: Ordered CT maxillofacial with contrast to evaluate for possibility of abscess, orbital cellulitis, facial cellulitis.  Medications/Fluids: Ordered: IV morphine and Zofran.  Most recent vital signs reviewed and are as follows: BP (!) 162/103 (BP Location: Left Arm) Comment: pt actively crying, c/o head pain  Pulse 90   Temp 98.1 F (36.7 C) (Oral)   Resp 20   Ht 5'  4" (1.626 m)   Wt (!) 140.6 kg   LMP 02/13/2023   SpO2 99%   BMI 53.21 kg/m   Initial impression: Severe facial pain, question of abscess and overlying cellulitis.   2:02 PM Patient crying out in pain per RN Additional medicine ordered.   4:33 PM Reassessment performed. Patient appears   Labs personally reviewed and interpreted including: CBC with white blood cell count 11.1, hemoglobin elevated at 15.1; BMP unremarkable.  Imaging personally visualized and interpreted including: CT maxillofacial with contrast agree, demonstrates dental abscess and associated swelling, mild mucosal disease in the right maxillary sinus, mild soft tissue stranding along the right cheek and jaw related to her dental infection  Reviewed pertinent lab work and imaging with patient at bedside. Questions answered.   Most current vital signs reviewed and are as follows: BP (!) 158/97   Pulse 84   Temp 98.1 F (36.7 C) (Oral)   Resp 20   Ht 5\' 4"  (1.626 m)   Wt (!) 140.6 kg   LMP  02/13/2023   SpO2 100%   BMI 53.21 kg/m   Plan: Upon entering room, patient is standing, bent over the side of the bed, crying in pain.  She has received Dilaudid and morphine to this point.  I have offered additional pain medication.  At first she declined stating wants to use if the other medicine did not help.  Then she states that she is afraid to go home at this point being in so much pain.  After discussion with patient, she agrees to additional treatment.  Discussed that she will need dental follow-up and she states that she has an appointment on Thursday (5 days from now).  5:14 PM called back to patient's room.  She continues to be tearful.  She is once again refusing the pain medication.  She is asking for a "stronger antibiotic".  We discussed the appropriateness of clindamycin and that she has only taken 2 doses up to this point, and that the antibiotics typically take 48 to 72 hours to work.  She inquires about receiving another dose of the antibiotic that she received at the office yesterday.  This was 1 g of ceftriaxone.  She states that after receiving this was the only time she had improvement in her pain and swelling.    Most current vital signs reviewed and are as follows: BP (!) 158/97   Pulse 84   Temp 98.1 F (36.7 C) (Oral)   Resp 20   Ht 5\' 4"  (1.626 m)   Wt (!) 140.6 kg   LMP 02/13/2023   SpO2 100%   BMI 53.21 kg/m   6:45 PM Reassessment performed. Patient appears much more comfortable now.  She is chewing on ice which also seems to be helping with the pain.  Reviewed pertinent lab work and imaging with patient at bedside. Questions answered.   Most current vital signs reviewed and are as follows: BP (!) 158/97   Pulse 84   Temp 98.1 F (36.7 C) (Oral)   Resp 20   Ht 5\' 4"  (1.626 m)   Wt (!) 140.6 kg   LMP 02/13/2023   SpO2 100%   BMI 53.21 kg/m   Plan: Discharge to home.   Prescriptions written for: Oxycodone # 8 tablets.  Other home care  instructions discussed: Continue clindamycin.  ED return instructions discussed: Return with worsening facial swelling, difficulty breathing or swallowing, fevers.  Follow-up instructions discussed: Patient encouraged to follow-up with dentist this  upcoming week as planned                             Medical Decision Making Amount and/or Complexity of Data Reviewed Labs: ordered. Radiology: ordered.  Risk Prescription drug management.   Patient presents for dental pain.  Severe in the emergency department requiring multiple doses of medication to adequately control.  They do not have a fever and do not appear septic. Exam unconcerning for Ludwig's angina or other deep tissue infection in neck. Low suspicion for PTA, RPA, epiglottis based on exam.  Given severe pain, CT ordered to confirm suspected abscess.  She does have some facial cellulitis as well  Patient will continue clindamycin for dental infection. Encouraged tylenol/NSAIDs as prescribed or as directed on the packaging for pain as well as prescribed pain medication. Encouraged follow-up with a dentist for definitive and long-term management.          Final Clinical Impression(s) / ED Diagnoses Final diagnoses:  Dental abscess  Right sided facial pain    Rx / DC Orders ED Discharge Orders          Ordered    oxyCODONE (OXY IR/ROXICODONE) 5 MG immediate release tablet  Every 6 hours PRN,   Status:  Discontinued        04/06/23 1840    oxyCODONE (OXY IR/ROXICODONE) 5 MG immediate release tablet  Every 6 hours PRN        04/06/23 1841              Renne Crigler, PA-C 04/06/23 1848    Terrilee Files, MD 04/07/23 [phone]

## 2023-04-06 NOTE — ED Notes (Signed)
Ice water provided to pt
